# Patient Record
Sex: Male | Born: 1975 | Race: White | Hispanic: No | Marital: Married | Smoking: Never smoker
Health system: Southern US, Community
[De-identification: ages and names within clinical notes are randomized; demographics above are authoritative.]

## PROBLEM LIST (undated history)

## (undated) DIAGNOSIS — F909 Attention-deficit hyperactivity disorder, unspecified type: Secondary | ICD-10-CM

## (undated) DIAGNOSIS — Z789 Other specified health status: Secondary | ICD-10-CM

## (undated) DIAGNOSIS — I10 Essential (primary) hypertension: Secondary | ICD-10-CM

## (undated) HISTORY — PX: WISDOM TOOTH EXTRACTION: SHX21

## (undated) HISTORY — PX: ELBOW SURGERY: SHX618

---

## 2010-09-26 ENCOUNTER — Ambulatory Visit: Payer: Self-pay | Admitting: Family

## 2018-02-07 ENCOUNTER — Ambulatory Visit (HOSPITAL_COMMUNITY): Payer: Commercial Managed Care - PPO | Admitting: Certified Registered Nurse Anesthetist

## 2018-02-07 ENCOUNTER — Encounter (HOSPITAL_COMMUNITY)
Admission: RE | Disposition: A | Discharge status: Home / Self Care | Payer: Self-pay | Source: Ambulatory Visit | Attending: Neurosurgery

## 2018-02-07 ENCOUNTER — Encounter (HOSPITAL_COMMUNITY): Payer: Self-pay | Admitting: *Deleted

## 2018-02-07 ENCOUNTER — Ambulatory Visit (HOSPITAL_COMMUNITY): Payer: Commercial Managed Care - PPO

## 2018-02-07 ENCOUNTER — Ambulatory Visit (HOSPITAL_COMMUNITY)
Admission: RE | Admit: 2018-02-07 | Discharge: 2018-02-07 | Disposition: A | Discharge status: Home / Self Care | Payer: Commercial Managed Care - PPO | Source: Ambulatory Visit | Attending: Neurosurgery | Admitting: Neurosurgery

## 2018-02-07 ENCOUNTER — Other Ambulatory Visit: Payer: Self-pay | Admitting: Neurosurgery

## 2018-02-07 ENCOUNTER — Other Ambulatory Visit: Payer: Self-pay

## 2018-02-07 DIAGNOSIS — Z419 Encounter for procedure for purposes other than remedying health state, unspecified: Secondary | ICD-10-CM

## 2018-02-07 DIAGNOSIS — M5117 Intervertebral disc disorders with radiculopathy, lumbosacral region: Secondary | ICD-10-CM | POA: Insufficient documentation

## 2018-02-07 DIAGNOSIS — M5126 Other intervertebral disc displacement, lumbar region: Secondary | ICD-10-CM | POA: Diagnosis present

## 2018-02-07 DIAGNOSIS — M549 Dorsalgia, unspecified: Secondary | ICD-10-CM | POA: Diagnosis present

## 2018-02-07 HISTORY — PX: LUMBAR LAMINECTOMY/DECOMPRESSION MICRODISCECTOMY: SHX5026

## 2018-02-07 HISTORY — DX: Other specified health status: Z78.9

## 2018-02-07 LAB — HEMOGLOBIN: HEMOGLOBIN: 16.2 g/dL (ref 13.0–17.0)

## 2018-02-07 SURGERY — LUMBAR LAMINECTOMY/DECOMPRESSION MICRODISCECTOMY 1 LEVEL
Anesthesia: General | Site: Back | Laterality: Right

## 2018-02-07 MED ORDER — ONDANSETRON HCL 4 MG PO TABS: 4.0000 mg | ORAL_TABLET | Freq: Four times a day (QID) | ORAL | Status: DC | PRN

## 2018-02-07 MED ORDER — DEXAMETHASONE SODIUM PHOSPHATE 10 MG/ML IJ SOLN
INTRAMUSCULAR | Status: DC | PRN
  Administered 2018-02-07: 5 mg via INTRAVENOUS

## 2018-02-07 MED ORDER — SODIUM CHLORIDE 0.9% FLUSH: 3.0000 mL | INTRAVENOUS | Status: DC | PRN

## 2018-02-07 MED ORDER — BUPIVACAINE HCL (PF) 0.25 % IJ SOLN
INTRAMUSCULAR | Status: DC | PRN
  Administered 2018-02-07: 10 mL

## 2018-02-07 MED ORDER — ROCURONIUM BROMIDE 10 MG/ML (PF) SYRINGE
PREFILLED_SYRINGE | INTRAVENOUS | Status: DC | PRN
  Administered 2018-02-07: 10 mg via INTRAVENOUS
  Administered 2018-02-07: 50 mg via INTRAVENOUS

## 2018-02-07 MED ORDER — OXYCODONE HCL 5 MG PO TABS
ORAL_TABLET | ORAL | Status: AC
  Filled 2018-02-07: qty 1

## 2018-02-07 MED ORDER — ONDANSETRON HCL 4 MG/2ML IJ SOLN
INTRAMUSCULAR | Status: AC
  Filled 2018-02-07: qty 2

## 2018-02-07 MED ORDER — LIDOCAINE-EPINEPHRINE 1 %-1:100000 IJ SOLN
INTRAMUSCULAR | Status: AC
  Filled 2018-02-07: qty 1

## 2018-02-07 MED ORDER — OXYCODONE HCL 5 MG PO TABS
10.0000 mg | ORAL_TABLET | ORAL | Status: DC | PRN
  Administered 2018-02-07: 10 mg via ORAL

## 2018-02-07 MED ORDER — CEFAZOLIN SODIUM-DEXTROSE 2-4 GM/100ML-% IV SOLN: 2.0000 g | Freq: Three times a day (TID) | INTRAVENOUS | Status: DC

## 2018-02-07 MED ORDER — ACETAMINOPHEN 325 MG PO TABS: 650.0000 mg | ORAL_TABLET | ORAL | Status: DC | PRN

## 2018-02-07 MED ORDER — ALUM & MAG HYDROXIDE-SIMETH 200-200-20 MG/5ML PO SUSP: 30.0000 mL | Freq: Four times a day (QID) | ORAL | Status: DC | PRN

## 2018-02-07 MED ORDER — THROMBIN 5000 UNITS EX SOLR
CUTANEOUS | Status: AC
  Filled 2018-02-07: qty 5000

## 2018-02-07 MED ORDER — CEFAZOLIN SODIUM-DEXTROSE 2-4 GM/100ML-% IV SOLN
INTRAVENOUS | Status: AC
  Filled 2018-02-07: qty 100

## 2018-02-07 MED ORDER — SODIUM CHLORIDE 0.9 % IV SOLN: 250.0000 mL | INTRAVENOUS | Status: DC

## 2018-02-07 MED ORDER — BUPIVACAINE HCL (PF) 0.25 % IJ SOLN
INTRAMUSCULAR | Status: AC
  Filled 2018-02-07: qty 30

## 2018-02-07 MED ORDER — 0.9 % SODIUM CHLORIDE (POUR BTL) OPTIME
TOPICAL | Status: DC | PRN
  Administered 2018-02-07: 1000 mL

## 2018-02-07 MED ORDER — ONDANSETRON HCL 4 MG/2ML IJ SOLN: 4.0000 mg | Freq: Once | INTRAMUSCULAR | Status: DC | PRN

## 2018-02-07 MED ORDER — MIDAZOLAM HCL 2 MG/2ML IJ SOLN
INTRAMUSCULAR | Status: DC | PRN
  Administered 2018-02-07: 2 mg via INTRAVENOUS

## 2018-02-07 MED ORDER — SODIUM CHLORIDE 0.9% FLUSH
3.0000 mL | Freq: Two times a day (BID) | INTRAVENOUS | Status: DC
  Administered 2018-02-07: 3 mL via INTRAVENOUS

## 2018-02-07 MED ORDER — CYCLOBENZAPRINE HCL 10 MG PO TABS
ORAL_TABLET | ORAL | Status: AC
  Filled 2018-02-07: qty 1

## 2018-02-07 MED ORDER — OXYCODONE HCL 10 MG PO TABS: 10.0000 mg | ORAL_TABLET | ORAL | 0 refills | Status: DC | PRN

## 2018-02-07 MED ORDER — FENTANYL CITRATE (PF) 100 MCG/2ML IJ SOLN: 25.0000 ug | INTRAMUSCULAR | Status: DC | PRN

## 2018-02-07 MED ORDER — FENTANYL CITRATE (PF) 250 MCG/5ML IJ SOLN
INTRAMUSCULAR | Status: DC | PRN
  Administered 2018-02-07 (×2): 50 ug via INTRAVENOUS
  Administered 2018-02-07: 100 ug via INTRAVENOUS

## 2018-02-07 MED ORDER — MIDAZOLAM HCL 2 MG/2ML IJ SOLN
INTRAMUSCULAR | Status: AC
  Filled 2018-02-07: qty 2

## 2018-02-07 MED ORDER — SUGAMMADEX SODIUM 200 MG/2ML IV SOLN
INTRAVENOUS | Status: DC | PRN
  Administered 2018-02-07: 100 mg via INTRAVENOUS

## 2018-02-07 MED ORDER — FENTANYL CITRATE (PF) 250 MCG/5ML IJ SOLN
INTRAMUSCULAR | Status: AC
  Filled 2018-02-07: qty 5

## 2018-02-07 MED ORDER — BACITRACIN 50000 UNITS IM SOLR
INTRAMUSCULAR | Status: DC | PRN
  Administered 2018-02-07: 17:00:00

## 2018-02-07 MED ORDER — ACETAMINOPHEN 650 MG RE SUPP: 650.0000 mg | RECTAL | Status: DC | PRN

## 2018-02-07 MED ORDER — PHENOL 1.4 % MT LIQD: 1.0000 | OROMUCOSAL | Status: DC | PRN

## 2018-02-07 MED ORDER — CEFAZOLIN SODIUM-DEXTROSE 2-3 GM-%(50ML) IV SOLR
INTRAVENOUS | Status: DC | PRN
  Administered 2018-02-07: 2 g via INTRAVENOUS

## 2018-02-07 MED ORDER — HEMOSTATIC AGENTS (NO CHARGE) OPTIME
TOPICAL | Status: DC | PRN
  Administered 2018-02-07: 1

## 2018-02-07 MED ORDER — LIDOCAINE-EPINEPHRINE 1 %-1:100000 IJ SOLN
INTRAMUSCULAR | Status: DC | PRN
  Administered 2018-02-07: 10 mL

## 2018-02-07 MED ORDER — CYCLOBENZAPRINE HCL 10 MG PO TABS
10.0000 mg | ORAL_TABLET | Freq: Three times a day (TID) | ORAL | Status: DC | PRN
  Administered 2018-02-07: 10 mg via ORAL

## 2018-02-07 MED ORDER — ROCURONIUM BROMIDE 10 MG/ML (PF) SYRINGE
PREFILLED_SYRINGE | INTRAVENOUS | Status: AC
  Filled 2018-02-07: qty 10

## 2018-02-07 MED ORDER — LACTATED RINGERS IV SOLN
INTRAVENOUS | Status: DC
  Administered 2018-02-07: 13:00:00 via INTRAVENOUS

## 2018-02-07 MED ORDER — MENTHOL 3 MG MT LOZG: 1.0000 | LOZENGE | OROMUCOSAL | Status: DC | PRN

## 2018-02-07 MED ORDER — HYDROMORPHONE HCL 1 MG/ML IJ SOLN
0.5000 mg | INTRAMUSCULAR | Status: DC | PRN
  Administered 2018-02-07: 0.5 mg via INTRAVENOUS
  Filled 2018-02-07: qty 0.5

## 2018-02-07 MED ORDER — DEXAMETHASONE SODIUM PHOSPHATE 10 MG/ML IJ SOLN
INTRAMUSCULAR | Status: AC
  Filled 2018-02-07: qty 2

## 2018-02-07 MED ORDER — ONDANSETRON HCL 4 MG/2ML IJ SOLN
INTRAMUSCULAR | Status: DC | PRN
  Administered 2018-02-07: 4 mg via INTRAVENOUS

## 2018-02-07 MED ORDER — THROMBIN 5000 UNITS EX SOLR
CUTANEOUS | Status: DC | PRN
  Administered 2018-02-07 (×2): 5000 [IU] via TOPICAL

## 2018-02-07 MED ORDER — PROPOFOL 10 MG/ML IV BOLUS
INTRAVENOUS | Status: AC
  Filled 2018-02-07: qty 40

## 2018-02-07 MED ORDER — LIDOCAINE 2% (20 MG/ML) 5 ML SYRINGE
INTRAMUSCULAR | Status: AC
  Filled 2018-02-07: qty 5

## 2018-02-07 MED ORDER — ONDANSETRON HCL 4 MG/2ML IJ SOLN: 4.0000 mg | Freq: Four times a day (QID) | INTRAMUSCULAR | Status: DC | PRN

## 2018-02-07 MED ORDER — GABAPENTIN 300 MG PO CAPS: 300.0000 mg | ORAL_CAPSULE | Freq: Two times a day (BID) | ORAL | Status: DC

## 2018-02-07 MED ORDER — LIDOCAINE 2% (20 MG/ML) 5 ML SYRINGE
INTRAMUSCULAR | Status: DC | PRN
  Administered 2018-02-07: 60 mg via INTRAVENOUS

## 2018-02-07 MED ORDER — PROPOFOL 10 MG/ML IV BOLUS
INTRAVENOUS | Status: DC | PRN
  Administered 2018-02-07: 170 mg via INTRAVENOUS

## 2018-02-07 SURGICAL SUPPLY — 55 items
BAG DECANTER FOR FLEXI CONT (MISCELLANEOUS) ×3 IMPLANT
BENZOIN TINCTURE PRP APPL 2/3 (GAUZE/BANDAGES/DRESSINGS) ×3 IMPLANT
BLADE CLIPPER SURG (BLADE) IMPLANT
BLADE SURG 11 STRL SS (BLADE) ×3 IMPLANT
BUR CUTTER 7.0 ROUND (BURR) ×3 IMPLANT
BUR MATCHSTICK NEURO 3.0 LAGG (BURR) ×3 IMPLANT
CANISTER SUCT 3000ML PPV (MISCELLANEOUS) ×3 IMPLANT
CARTRIDGE OIL MAESTRO DRILL (MISCELLANEOUS) ×1 IMPLANT
CLOSURE WOUND 1/2 X4 (GAUZE/BANDAGES/DRESSINGS) ×1
DECANTER SPIKE VIAL GLASS SM (MISCELLANEOUS) ×3 IMPLANT
DERMABOND ADVANCED (GAUZE/BANDAGES/DRESSINGS) ×2
DERMABOND ADVANCED .7 DNX12 (GAUZE/BANDAGES/DRESSINGS) ×1 IMPLANT
DIFFUSER DRILL AIR PNEUMATIC (MISCELLANEOUS) ×3 IMPLANT
DRAPE HALF SHEET 40X57 (DRAPES) ×3 IMPLANT
DRAPE LAPAROTOMY 100X72X124 (DRAPES) ×3 IMPLANT
DRAPE MICROSCOPE LEICA (MISCELLANEOUS) ×3 IMPLANT
DRAPE SURG 17X23 STRL (DRAPES) ×3 IMPLANT
DRSG OPSITE POSTOP 3X4 (GAUZE/BANDAGES/DRESSINGS) ×3 IMPLANT
DRSG OPSITE POSTOP 4X6 (GAUZE/BANDAGES/DRESSINGS) ×3 IMPLANT
DURAPREP 26ML APPLICATOR (WOUND CARE) ×3 IMPLANT
ELECT REM PT RETURN 9FT ADLT (ELECTROSURGICAL) ×3
ELECTRODE REM PT RTRN 9FT ADLT (ELECTROSURGICAL) ×1 IMPLANT
GAUZE 4X4 16PLY RFD (DISPOSABLE) IMPLANT
GAUZE SPONGE 4X4 12PLY STRL (GAUZE/BANDAGES/DRESSINGS) ×3 IMPLANT
GLOVE BIO SURGEON STRL SZ7 (GLOVE) ×3 IMPLANT
GLOVE BIO SURGEON STRL SZ8 (GLOVE) ×9 IMPLANT
GLOVE BIO SURGEON STRL SZ8.5 (GLOVE) ×6 IMPLANT
GLOVE BIOGEL PI IND STRL 7.0 (GLOVE) ×1 IMPLANT
GLOVE BIOGEL PI IND STRL 8 (GLOVE) ×3 IMPLANT
GLOVE BIOGEL PI INDICATOR 7.0 (GLOVE) ×2
GLOVE BIOGEL PI INDICATOR 8 (GLOVE) ×6
GLOVE ECLIPSE 8.0 STRL XLNG CF (GLOVE) ×9 IMPLANT
GLOVE INDICATOR 8.5 STRL (GLOVE) ×3 IMPLANT
GOWN STRL REUS W/ TWL LRG LVL3 (GOWN DISPOSABLE) ×1 IMPLANT
GOWN STRL REUS W/ TWL XL LVL3 (GOWN DISPOSABLE) ×4 IMPLANT
GOWN STRL REUS W/TWL 2XL LVL3 (GOWN DISPOSABLE) IMPLANT
GOWN STRL REUS W/TWL LRG LVL3 (GOWN DISPOSABLE) ×2
GOWN STRL REUS W/TWL XL LVL3 (GOWN DISPOSABLE) ×8
KIT BASIN OR (CUSTOM PROCEDURE TRAY) ×3 IMPLANT
KIT TURNOVER KIT B (KITS) ×3 IMPLANT
NEEDLE HYPO 22GX1.5 SAFETY (NEEDLE) ×3 IMPLANT
NEEDLE SPNL 22GX3.5 QUINCKE BK (NEEDLE) ×3 IMPLANT
NS IRRIG 1000ML POUR BTL (IV SOLUTION) ×3 IMPLANT
OIL CARTRIDGE MAESTRO DRILL (MISCELLANEOUS) ×3
PACK LAMINECTOMY NEURO (CUSTOM PROCEDURE TRAY) ×3 IMPLANT
RUBBERBAND STERILE (MISCELLANEOUS) ×6 IMPLANT
SPONGE SURGIFOAM ABS GEL SZ50 (HEMOSTASIS) ×3 IMPLANT
STRIP CLOSURE SKIN 1/2X4 (GAUZE/BANDAGES/DRESSINGS) ×2 IMPLANT
SUT VIC AB 0 CT1 18XCR BRD8 (SUTURE) ×1 IMPLANT
SUT VIC AB 0 CT1 8-18 (SUTURE) ×2
SUT VIC AB 2-0 CT1 18 (SUTURE) ×3 IMPLANT
SUT VICRYL 4-0 PS2 18IN ABS (SUTURE) ×3 IMPLANT
TOWEL GREEN STERILE (TOWEL DISPOSABLE) ×3 IMPLANT
TOWEL GREEN STERILE FF (TOWEL DISPOSABLE) ×3 IMPLANT
WATER STERILE IRR 1000ML POUR (IV SOLUTION) ×3 IMPLANT

## 2018-02-07 NOTE — H&P (Signed)
Phillip MoralesJustin Thornton is an 42 y.o. male.   Chief Complaint: back and right leg pain HPI: 42 year old gentleman with severe back and right leg pain workup revealed a very large disc herniation with a free fragment migrating caudally due to patient progressive clinical syndrome imaging findings and failure conservative treatment I recommended laminectomy microdiscectomy at L5-S1 the right. I extensively went over the risks and benefits of the operation with him as well as perioperative course expectations of outcome and alternatives of surgery and he understood and agreed to proceed forward.  Past Medical History:  Diagnosis Date  . Medical history non-contributory     Past Surgical History:  Procedure Laterality Date  . ELBOW SURGERY     scar tissue resection    History reviewed. No pertinent family history. Social History:  reports that he has never smoked. He has never used smokeless tobacco. He reports that he drinks about 2.0 standard drinks of alcohol per week. He reports that he does not use drugs.  Allergies: No Known Allergies  Medications Prior to Admission  Medication Sig Dispense Refill  . gabapentin (NEURONTIN) 300 MG capsule Take 300 mg by mouth 2 (two) times daily.      Results for orders placed or performed during the hospital encounter of 02/07/18 (from the past 48 hour(s))  Hemoglobin     Status: None   Collection Time: 02/07/18  1:09 PM  Result Value Ref Range   Hemoglobin 16.2 13.0 - 17.0 g/dL    Comment: Performed at [facility], [address] [place], [place] [address]   No results found.  Review of Systems  Musculoskeletal: Positive for back pain.  Neurological: Positive for sensory change and focal weakness.    Blood pressure (!) 163/105, pulse 70, temperature 97.8 F (36.6 C), temperature source Oral, resp. rate 20, height 6\' 3"  (1.905 m), weight 93 kg, SpO2 98 %. Physical Exam  Constitutional: He is oriented to person, place, and time. He appears  well-developed and well-nourished.  HENT:  Head: Normocephalic.  Eyes: Pupils are equal, round, and reactive to light.  Neck: Normal range of motion.  Respiratory: Effort normal.  GI: Soft.  Musculoskeletal: Normal range of motion.  Neurological: He is alert and oriented to person, place, and time. He has normal strength. GCS eye subscore is 4. GCS verbal subscore is 5. GCS motor subscore is 6.  Strength is 4+ out of 5 plantar flexion right foot     Assessment/Plan 42 year old Jones a right-sided L5-S1 microdiscectomy  Kristianne Albin P, MD 02/07/2018, 3:25 PM

## 2018-02-07 NOTE — Op Note (Signed)
Preoperative diagnosis: Right S1 radiculopathy from herniated nucleus pulposis L5-S1 right  Postoperative diagnosis: Same  Procedure: Lumbar limiting microscopic discectomy L5-S1 right with microdissection of the right S1 nerve root microscopic discectomy  Surgeon: Jillyn HiddenGary Timira Bieda  Assist: Verlin DikeKimberly Meyran  Anesthesia: Gen.  EBL: The  History of present illness: 42 year old gentleman who's had severe back and right leg pain rating down S1 nerve root pattern workup revealed a very large disc herniation with free fragment migrating caudally. Into the patient's failure conservative treatment imaging findings and progressive clinical syndrome or recommended laminectomy microdiscectomy at L5-S1 on the right. I extensively went over the risks and benefits of the operation with him as well as perioperative course expectations of outcome and alternatives surgery and he understood and agreed to proceed forward.  Operative procedure: Patient brought into the or was induced under general anesthesia positioned prone the Wilson frame his back was prepped and draped in routine sterile fashion. Preoperative x-ray localize the appropriate level. After infiltration of 10 mL lidocaine with epi a midline incision was made and Bovie light cautery was used to take down the subcutaneous tissue and subperiosteal dissections care lamina of L5 and S1 on the right. Interoperative x-ray confirmed an indication proper level. So utilizing high-speed drill the inferior aspect lamina 5 medial facet complex super aspect of lamina of S1 was drilled down with close identified and removed in piecemeal fashion thecal sac was in visualized operative microscope was draped and brought into the field. Under Mike's cup illumination first further under biting the medial gutter allowed identification of the S1 pedicle and the S1 nerve root. The large free fragment disc herniation present within the axilla of the S1 nerve root this was teased out of  the axilla of the nerve hook and several large free fragments removed. Then the S1 nerve root was mobilized over the disc space disc space was incised and cleanout pituitary rongeurs. At the discectomy was no further stenosis or free fragments appreciated the wound scope was irrigated meticulous hemostasis was maintained Gelfoam was overlaid top of the dura the muscle fascia proximally layers with after Vicryl skin was closed running 4 subcuticular Dermabond benzo and Steri-Strips and sterile dressing was applied patient recovered in stable condition. At the case all needle, sponge counts were correct.

## 2018-02-07 NOTE — Anesthesia Postprocedure Evaluation (Signed)
Anesthesia Post Note  Patient: Lysle MoralesJustin Benett  Procedure(s) Performed: Microdiscectomy - Lumbar Five - Sacral One - right (Right Back)     Patient location during evaluation: PACU Anesthesia Type: General Level of consciousness: awake and alert Pain management: pain level controlled Vital Signs Assessment: post-procedure vital signs reviewed and stable Respiratory status: spontaneous breathing, nonlabored ventilation, respiratory function stable and patient connected to nasal cannula oxygen Cardiovascular status: blood pressure returned to baseline and stable Postop Assessment: no apparent nausea or vomiting Anesthetic complications: no    Last Vitals:  Vitals:   02/07/18 1854 02/07/18 1950  BP: (!) 153/92 (!) 141/83  Pulse: 64 74  Resp: 18 20  Temp: 36.6 C 37.1 C  SpO2: 100% 98%    Last Pain:  Vitals:   02/07/18 1950  TempSrc: Oral  PainSc:                  Daziyah Cogan

## 2018-02-07 NOTE — Discharge Instructions (Signed)
Wound Care Keep the incision clean and dry remove the outer dressing in 2 days, leave the Steri-Strips intact. Wrap with Saran wrap for showers only Do not put any creams, lotions, or ointments on incision. Leave steri-strips on back.  They will fall off by themselves.  Activity Walk each and every day, increasing distance each day. No lifting greater than 5 lbs.  No lifting no bending no twisting no driving or riding a car unless coming back and forth to see me.  Diet Resume your normal diet.   Return to Work Will be discussed at you follow up appointment.  Call Your Doctor If Any of These Occur Redness, drainage, or swelling at the wound.  Temperature greater than 101 degrees. Severe pain not relieved by pain medication. Incision starts to come apart.  Follow Up Appt Call today for appointment in 1-2 weeks ([phone]) or for problems.  If you have any hardware placed in your spine, you will need an x-ray before your appointment.   

## 2018-02-07 NOTE — Transfer of Care (Signed)
Immediate Anesthesia Transfer of Care Note  Patient: Phillip Thornton  Procedure(s) Performed: Microdiscectomy - Lumbar Five - Sacral One - right (Right Back)  Patient Location: PACU  Anesthesia Type:General  Level of Consciousness: awake, oriented and patient cooperative  Airway & Oxygen Therapy: Patient Spontanous Breathing and Patient connected to nasal cannula oxygen  Post-op Assessment: Report given to RN, Post -op Vital signs reviewed and stable and Patient moving all extremities  Post vital signs: Reviewed and stable  Last Vitals:  Vitals Value Taken Time  BP    Temp    Pulse 70 02/07/2018  5:42 PM  Resp 17 02/07/2018  5:42 PM  SpO2 98 % 02/07/2018  5:42 PM  Vitals shown include unvalidated device data.  Last Pain:  Vitals:   02/07/18 1320  TempSrc:   PainSc: 5       Patients Stated Pain Goal: 3 (02/07/18 1320)  Complications: No apparent anesthesia complications

## 2018-02-07 NOTE — Progress Notes (Signed)
Patient d/ced this evening as planned. Ate regular food and ambulated about 27000ft. Felt well and was discharged home accompanied with His significant other. Assessments remained unchanged prior to discharged.

## 2018-02-07 NOTE — Anesthesia Preprocedure Evaluation (Signed)
Anesthesia Evaluation  Patient identified by MRN, date of birth, ID band Patient awake    Reviewed: Allergy & Precautions, NPO status , Patient's Chart, lab work & pertinent test results  Airway Mallampati: II  TM Distance: >3 FB Neck ROM: Full    Dental  (+) Dental Advisory Given   Pulmonary neg pulmonary ROS,    breath sounds clear to auscultation       Cardiovascular negative cardio ROS   Rhythm:Regular Rate:Normal     Neuro/Psych HNP    GI/Hepatic negative GI ROS, Neg liver ROS,   Endo/Other  negative endocrine ROS  Renal/GU negative Renal ROS     Musculoskeletal   Abdominal   Peds  Hematology negative hematology ROS (+)   Anesthesia Other Findings   Reproductive/Obstetrics                             Lab Results  Component Value Date   HGB 16.2 02/07/2018   No results found for: CREATININE, BUN, NA, K, CL, CO2  Anesthesia Physical Anesthesia Plan  ASA: I  Anesthesia Plan: General   Post-op Pain Management:    Induction: Intravenous  PONV Risk Score and Plan: 2 and Dexamethasone, Ondansetron and Treatment may vary due to age or medical condition  Airway Management Planned: Oral ETT  Additional Equipment:   Intra-op Plan:   Post-operative Plan: Extubation in OR  Informed Consent: I have reviewed the patients History and Physical, chart, labs and discussed the procedure including the risks, benefits and alternatives for the proposed anesthesia with the patient or authorized representative who has indicated his/her understanding and acceptance.   Dental advisory given  Plan Discussed with: CRNA  Anesthesia Plan Comments:         Anesthesia Quick Evaluation

## 2018-02-07 NOTE — Anesthesia Procedure Notes (Signed)
Procedure Name: Intubation Date/Time: 02/07/2018 4:10 PM Performed by: Valda Favia, CRNA Pre-anesthesia Checklist: Patient identified, Emergency Drugs available, Suction available and Patient being monitored Patient Re-evaluated:Patient Re-evaluated prior to induction Oxygen Delivery Method: Circle System Utilized Preoxygenation: Pre-oxygenation with 100% oxygen Induction Type: IV induction Ventilation: Mask ventilation without difficulty Laryngoscope Size: Mac and 4 Grade View: Grade II Tube type: Oral Tube size: 7.5 mm Number of attempts: 1 Airway Equipment and Method: Stylet and Oral airway Placement Confirmation: ETT inserted through vocal cords under direct vision,  positive ETCO2 and breath sounds checked- equal and bilateral Secured at: 23 cm Tube secured with: Tape Dental Injury: Teeth and Oropharynx as per pre-operative assessment

## 2018-02-08 ENCOUNTER — Encounter (HOSPITAL_COMMUNITY): Payer: Self-pay | Admitting: Neurosurgery

## 2018-03-11 ENCOUNTER — Other Ambulatory Visit: Payer: Self-pay | Admitting: Otolaryngology

## 2018-03-11 DIAGNOSIS — H918X1 Other specified hearing loss, right ear: Secondary | ICD-10-CM

## 2018-03-11 DIAGNOSIS — IMO0001 Reserved for inherently not codable concepts without codable children: Secondary | ICD-10-CM

## 2018-03-18 ENCOUNTER — Other Ambulatory Visit: Payer: Commercial Managed Care - PPO

## 2018-03-22 ENCOUNTER — Ambulatory Visit
Admission: RE | Admit: 2018-03-22 | Discharge: 2018-03-22 | Disposition: A | Discharge status: Home / Self Care | Payer: Commercial Managed Care - PPO | Source: Ambulatory Visit | Attending: Otolaryngology | Admitting: Otolaryngology

## 2018-03-22 DIAGNOSIS — IMO0001 Reserved for inherently not codable concepts without codable children: Secondary | ICD-10-CM

## 2018-03-22 DIAGNOSIS — H918X1 Other specified hearing loss, right ear: Secondary | ICD-10-CM

## 2018-03-22 MED ORDER — GADOBENATE DIMEGLUMINE 529 MG/ML IV SOLN
19.0000 mL | Freq: Once | INTRAVENOUS | Status: AC | PRN
  Administered 2018-03-22: 19 mL via INTRAVENOUS

## 2019-11-11 ENCOUNTER — Ambulatory Visit: Payer: Self-pay | Attending: Internal Medicine

## 2019-11-11 DIAGNOSIS — Z23 Encounter for immunization: Secondary | ICD-10-CM

## 2019-11-11 NOTE — Progress Notes (Signed)
   Covid-19 Vaccination Clinic  Name:  Phillip Thornton    MRN: [id number] DOB: Oct 31, 1975  11/11/2019  Mr. Blick was observed post Covid-19 immunization for 15 minutes without incident. He was provided with Vaccine Information Sheet and instruction to access the V-Safe system.   Mr. Alarie was instructed to call 911 with any severe reactions post vaccine: Marland Kitchen Difficulty breathing  . Swelling of face and throat  . A fast heartbeat  . A bad rash all over body  . Dizziness and weakness   Immunizations Administered    Name Date Dose VIS Date Route   Pfizer COVID-19 Vaccine 11/11/2019  9:10 AM 0.3 mL 08/23/2018 Intramuscular   Manufacturer: [employer], [employer]   Lot: [id number]   NDC: [address]-3

## 2019-12-04 ENCOUNTER — Ambulatory Visit: Payer: Self-pay

## 2019-12-09 ENCOUNTER — Ambulatory Visit: Payer: Self-pay | Attending: Internal Medicine

## 2019-12-09 DIAGNOSIS — Z23 Encounter for immunization: Secondary | ICD-10-CM

## 2019-12-09 NOTE — Progress Notes (Signed)
   Covid-19 Vaccination Clinic  Name:  Phillip Thornton    MRN: [id number] DOB: 1976/06/15  12/09/2019  Phillip Thornton was observed post Covid-19 immunization for 15 minutes without incident. He was provided with Vaccine Information Sheet and instruction to access the V-Safe system.   [name] was instructed to call 911 with any severe reactions post vaccine: Marland Kitchen Difficulty breathing  . Swelling of face and throat  . A fast heartbeat  . A bad rash all over body  . Dizziness and weakness   Immunizations Administered    Name Date Dose VIS Date Route   Pfizer COVID-19 Vaccine 12/09/2019  9:55 AM 0.3 mL 08/23/2018 Intramuscular   Manufacturer: [employer], [employer]   Lot: [id number]   NDC: [address]-3

## 2020-01-14 IMAGING — MR MR HEAD WO/W CM
11 of 12 series · 39 of 48 positions shown · IV contrast (multihance)
Comparison: None.

CLINICAL DATA: 42-year-old male with asymmetric right ear hearing
loss. No known injury.

EXAM:
MRI HEAD WITHOUT AND WITH CONTRAST
TECHNIQUE: Multiplanar, multiecho pulse sequences of the brain and surrounding
structures were obtained without and with intravenous contrast.
CONTRAST:  19mL MULTIHANCE GADOBENATE DIMEGLUMINE 529 MG/ML IV SOLN

[Series 2: T1 · sagittal · 5.0mm · 0.47mm/px · 2 of 21 slices shown (1 of 3)]
[im 1/21]
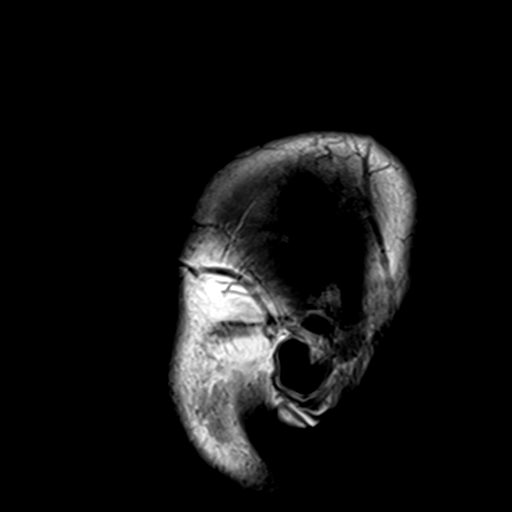
[im 21/21]
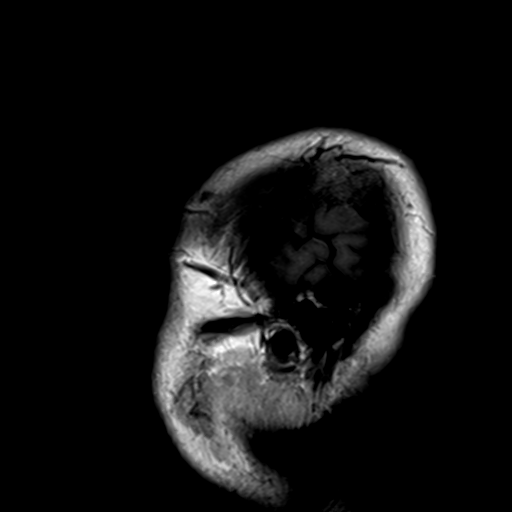

[Series 3: DWI · axial · 3.0mm · 1.88mm/px · z∈[-7,+140]mm · 11 of 100 slices shown (1 of 2)]
[im 1/100]
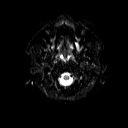
[im 10/100]
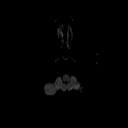
[im 20/100]
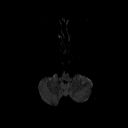
[im 30/100]
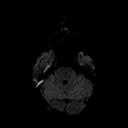
[im 40/100]
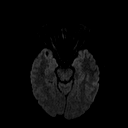
[im 50/100]
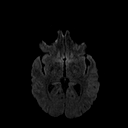
[im 60/100]
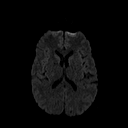
[im 70/100]
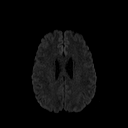
[im 80/100]
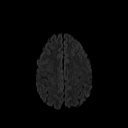
[im 90/100]
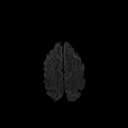
[im 100/100]
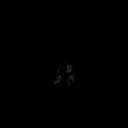

[Series 4: DWI · axial · 3.0mm · 1.88mm/px · z∈[-7,+140]mm · 5 of 49 slices shown (2 of 2)]
[im 1/49]
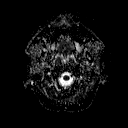
[im 13/49]
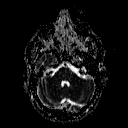
[im 25/49]
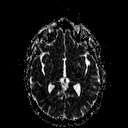
[im 37/49]
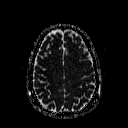
[im 49/49]
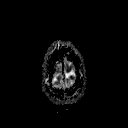

[Series 5: T2 · axial · 5.0mm · 0.47mm/px · z∈[-17,+138]mm · 3 of 25 slices shown]
[im 1/25]
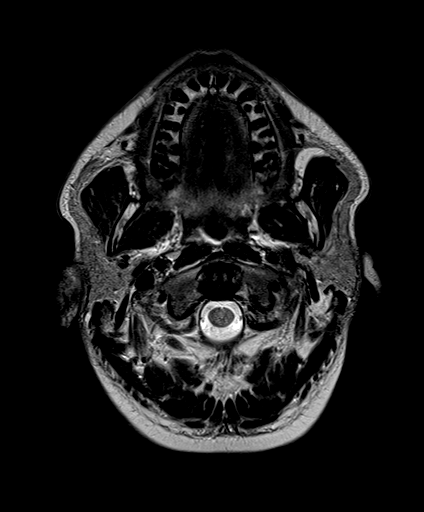
[im 13/25]
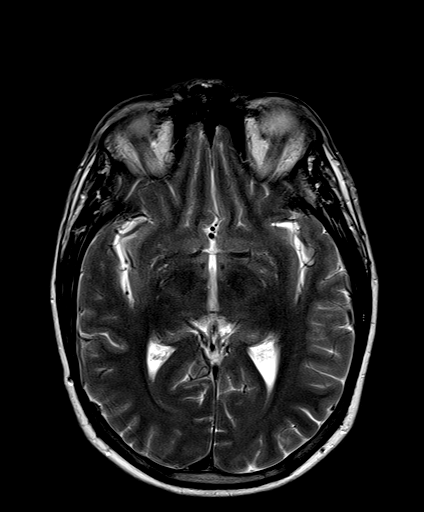
[im 25/25]
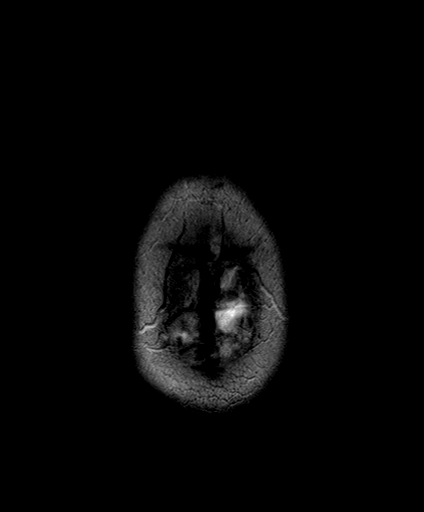

[Series 6: FLAIR · axial · 3.0mm · 0.47mm/px · z∈[-17,+138]mm · 3 of 27 slices shown]
[im 1/27]
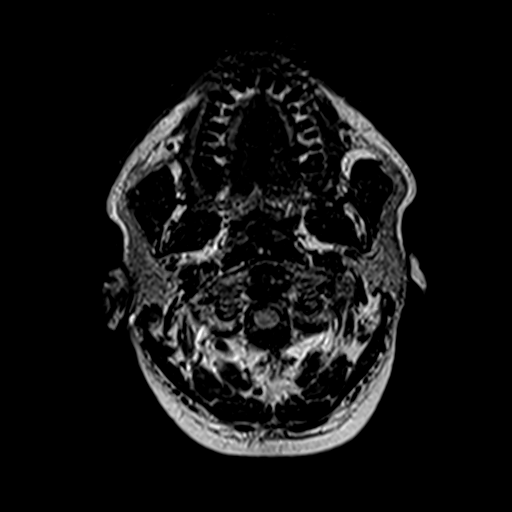
[im 14/27]
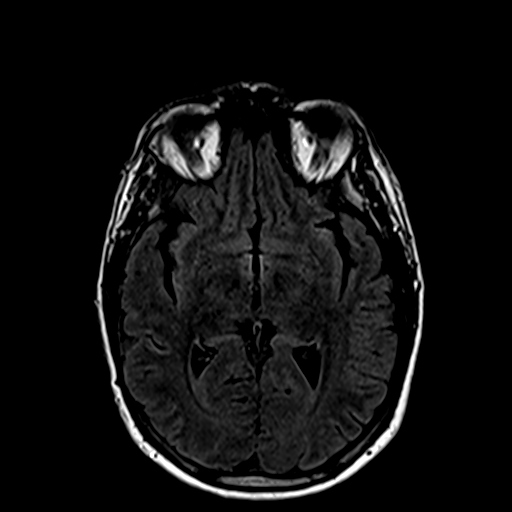
[im 27/27]
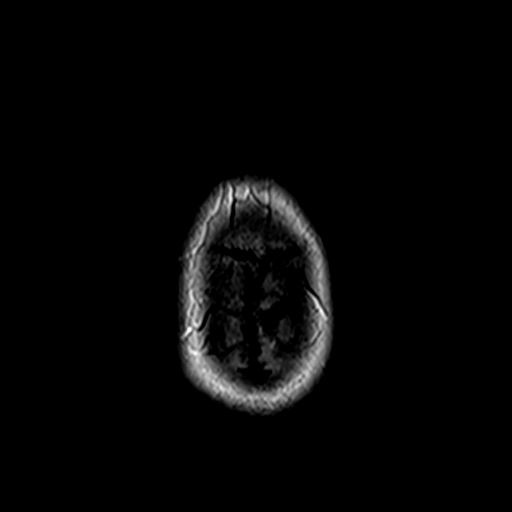

[Series 7: mip_images(sw) · axial · 16.0mm · 0.94mm/px · z∈[-11,+132]mm · 8 of 73 slices shown]
[im 1/73]
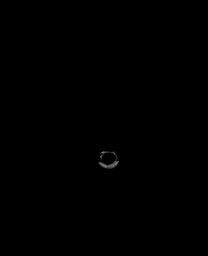
[im 11/73]
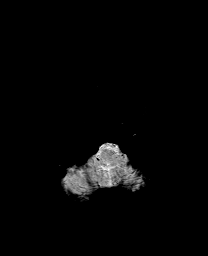
[im 21/73]
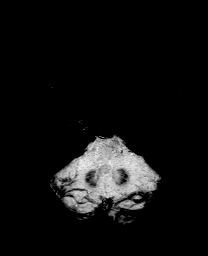
[im 31/73]
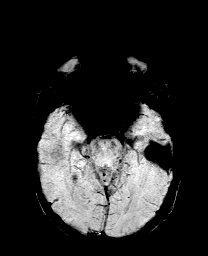
[im 42/73]
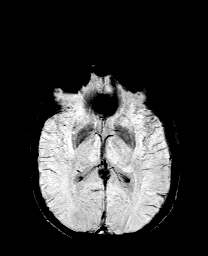
[im 52/73]
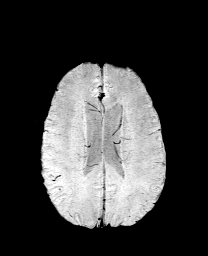
[im 62/73]
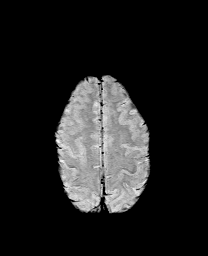
[im 73/73]
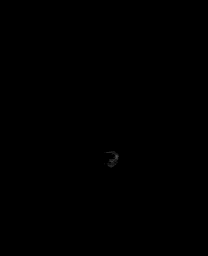

[Series 9: T1 · coronal · 3.0mm · 0.35mm/px · 1 of 11 slices shown (2 of 3)]
[im 1/11]
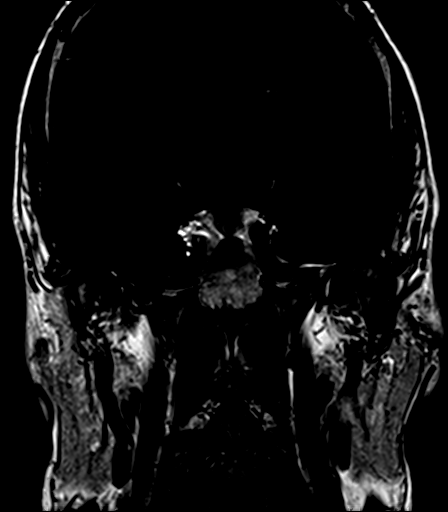

[Series 10: T1 · axial · 3.0mm · 0.35mm/px · 1 of 11 slices shown (3 of 3)]
[im 1/11]
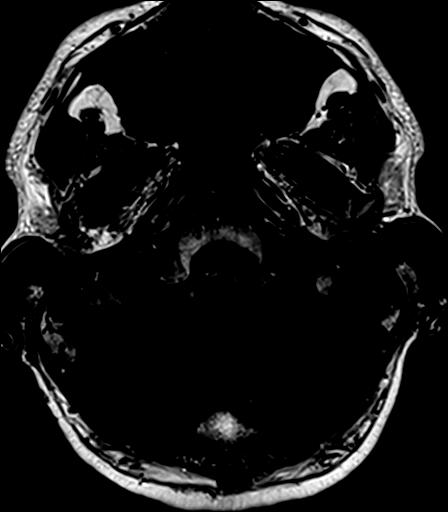

[Series 11: bSSFP · axial · 1.0mm · 0.28mm/px · z∈[+14,+37]mm · 3 of 36 slices shown]
[im 1/36]
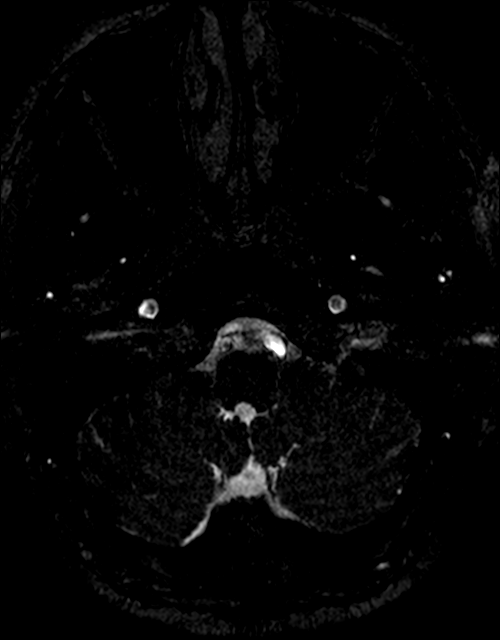
[im 12/36]
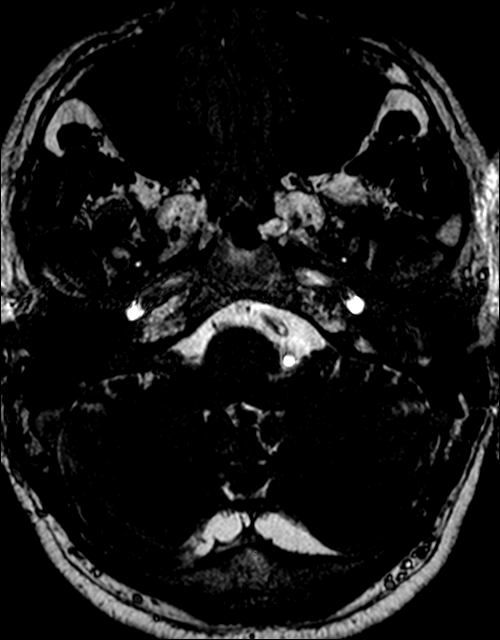
[im 24/36]
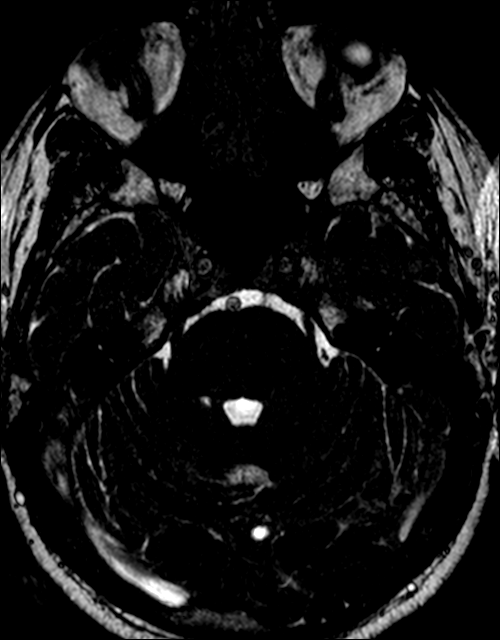

[Series 12: T1 post-contrast · coronal · 3.0mm · 0.35mm/px · 1 of 11 slices shown (1 of 2)]
[im 1/11]
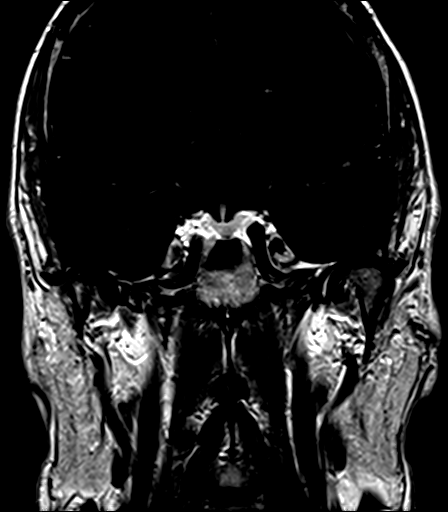

[Series 13: T1 post-contrast · axial · 3.0mm · 0.35mm/px · 1 of 11 slices shown (2 of 2)]
[im 1/11]
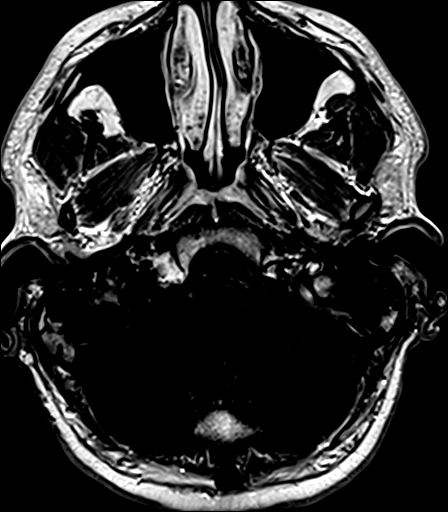

[39 of 48 positions shown; findings below may reference images not displayed]

FINDINGS: Brain: Cerebral volume is within normal limits. No restricted
diffusion to suggest acute infarction. No midline shift, evidence of
mass lesion, ventriculomegaly, extra-axial collection or acute
intracranial hemorrhage. Cervicomedullary junction and pituitary are
within normal limits.

Gray and white matter signal is within normal limits throughout the
brain. No encephalomalacia or chronic cerebral blood products
identified. No abnormal enhancement identified. No dural thickening.

Vascular: Major intracranial vascular flow voids are preserved. The
distal left vertebral artery is dominant and dolichoectatic with
tortuosity at the left cerebellopontine angle (series 5, image 7).

Skull and upper cervical spine: Negative visible cervical spine.
Normal bone marrow signal.

Sinuses/Orbits: Normal orbits soft tissues. Trace paranasal sinus
mucosal thickening. Scalp and face soft tissues appear negative.

Other: Dedicated internal auditory canal imaging. Distal vertebral
artery tortuosity with mass effect near the left [DATE] cranial nerve
root entry zone as seen on series 5, image 7. Otherwise normal
cerebellopontine angles. Normal bilateral cisternal and
intracanalicular 7th and 8th cranial nerve segments. Symmetric and
normal appearing T2 signal in the bilateral cochlea and vestibular
structures. No abnormal enhancement identified.

Trace mastoid air cell fluid on the right. Left mastoids are clear.
Normal stylomastoid foramina. Negative nasopharynx aside from trace
layering retained secretions on series 5, image 1. Negative parotid
glands.
IMPRESSION: 1. Normal internal auditory imaging aside from dolichoectatic distal
left vertebral artery with mass effect on or near the left 7th/8th
cranial nerve root entry zone.
2.  Normal MRI appearance of the brain.

## 2021-07-28 ENCOUNTER — Other Ambulatory Visit: Payer: Self-pay | Admitting: Orthopedic Surgery

## 2021-07-28 NOTE — Progress Notes (Addendum)
COVID swab appointment: N/A  COVID Vaccine Completed: Yes x2 Date COVID Vaccine completed: 11-11-19 12-09-19 Has received booster:  No COVID vaccine manufacturer: Pfizer      Date of COVID positive in last 90 days:  No  PCP - Alysia Penna, MD (office note on chart) Cardiologist - N/A  Chest x-ray -  N/A EKG - 07-29-21 Epic Stress Test -  N/A ECHO -  N/A Cardiac Cath -  N/A Pacemaker/ICD device last checked: Spinal Cord Stimulator:  Sleep Study -  N/A CPAP -   Fasting Blood Sugar -  N/A Checks Blood Sugar _____ times a day  Blood Thinner Instructions: N/A Aspirin Instructions: Last Dose:  Activity level:   Can go up a flight of stairs and perform activities of daily living without stopping and without symptoms of chest pain or shortness of breath.  Able to exercise without symptoms  Anesthesia review:  BP elevated at PAT 172/97 and on recheck 156/108.  Patient recently diagnosed with HTN and started on Losartan, took first dose 07-28-21.  Patient denies headache, chest pain or SOB.  He does not have a BP cuff at home to monitor.  Patient denies shortness of breath, fever, cough and chest pain at PAT appointment  Patient verbalized understanding of instructions that were given to them at the PAT appointment. Patient was also instructed that they will need to review over the PAT instructions again at home before surgery.

## 2021-07-28 NOTE — Patient Instructions (Addendum)
DUE TO COVID-19 ONLY ONE VISITOR IS ALLOWED TO COME WITH YOU AND STAY IN THE WAITING ROOM ONLY DURING PRE OP AND PROCEDURE.   **NO VISITORS ARE ALLOWED IN THE SHORT STAY AREA OR RECOVERY ROOM!!**  You are not required to quarantine, however you are required to wear a well-fitted mask when you are out and around people not in your household.  Hand Hygiene often Do NOT share personal items Notify your provider if you are in close contact with someone who has COVID or you develop fever 100.4 or greater, new onset of sneezing, cough, sore throat, shortness of breath or body aches.   Your procedure is scheduled on: Thursday, 07-31-21   Report to [facility] Main  Entrance     Report to admitting at 2:45 PM   Call this number if you have problems the morning of surgery [phone]   Do not eat food :After Midnight.   May have liquids until 2:00 PM day of surgery  CLEAR LIQUID DIET  Foods Allowed                                                                     Foods Excluded  Water, Black Coffee (no milk/no creamer) and tea, regular and decaf                              liquids that you cannot  Plain Jell-O in any flavor  (No red)                         see through such as: Fruit ices (not with fruit pulp)                                 milk, soups, orange juice  Iced Popsicles (No red)                                    All solid food                             Apple juices Sports drinks like Gatorade (No red) Lightly seasoned clear broth or consume(fat free) Sugar    Oral Hygiene is also important to reduce your risk of infection.                                    Remember - BRUSH YOUR TEETH THE MORNING OF SURGERY WITH YOUR REGULAR TOOTHPASTE   Do NOT smoke after Midnight   Take these medicines the morning of surgery with A SIP OF WATER:  Oxycodone   Stop all vitamins and herbal supplements a week before surgery             You may not have any metal on your  body including  jewelry, and body piercing             Do not wear  lotions,  powders, cologne, or deodorant              Men may shave face and neck.  Do not bring valuables to the hospital. [facility] IS NOT RESPONSIBLE FOR VALUABLES.   Contacts, dentures or bridgework may not be worn into surgery.    Patients discharged the day of surgery will not be allowed to drive home.   A responsible adult must remain with you for 24 hours after surgery.  Please read over the following fact sheets you were given: IF YOU HAVE QUESTIONS ABOUT YOUR PRE OP INSTRUCTIONS PLEASE CALL [phone] [facility] - Preparing for Surgery Before surgery, you can play an important role.  Because skin is not sterile, your skin needs to be as free of germs as possible.  You can reduce the number of germs on your skin by washing with CHG (chlorahexidine gluconate) soap before surgery.  CHG is an antiseptic cleaner which kills germs and bonds with the skin to continue killing germs even after washing. Please DO NOT use if you have an allergy to CHG or antibacterial soaps.  If your skin becomes reddened/irritated stop using the CHG and inform your nurse when you arrive at Short Stay. Do not shave (including legs and underarms) for at least 48 hours prior to the first CHG shower.  You may shave your face/neck.  Please follow these instructions carefully:  1.  Shower with CHG Soap the night before surgery and the  morning of surgery.  2.  If you choose to wash your hair, wash your hair first as usual with your normal  shampoo.  3.  After you shampoo, rinse your hair and body thoroughly to remove the shampoo.                             4.  Use CHG as you would any other liquid soap.  You can apply chg directly to the skin and wash.  Gently with a scrungie or clean washcloth.  5.  Apply the CHG Soap to your body ONLY FROM THE NECK DOWN.   Do   not use on face/ open                           Wound or open sores.  Avoid contact with eyes, ears mouth and   genitals (private parts).                       Wash face,  Genitals (private parts) with your normal soap.             6.  Wash thoroughly, paying special attention to the area where your    surgery  will be performed.  7.  Thoroughly rinse your body with warm water from the neck down.  8.  DO NOT shower/wash with your normal soap after using and rinsing off the CHG Soap.                9.  Pat yourself dry with a clean towel.            10.  Wear clean pajamas.            11.  Place clean sheets on your bed the night of your first shower and do not  sleep with pets. Day of Surgery : Do not apply any lotions/deodorants the  morning of surgery.  Please wear clean clothes to the hospital/surgery center.  FAILURE TO FOLLOW THESE INSTRUCTIONS MAY RESULT IN THE CANCELLATION OF YOUR SURGERY  PATIENT SIGNATURE_________________________________  NURSE SIGNATURE__________________________________  ________________________________________________________________________

## 2021-07-28 NOTE — Progress Notes (Signed)
Surgery orders requested via Epic inbox. °

## 2021-07-29 ENCOUNTER — Encounter (HOSPITAL_COMMUNITY): Payer: Self-pay

## 2021-07-29 ENCOUNTER — Encounter (HOSPITAL_COMMUNITY)
Admission: RE | Admit: 2021-07-29 | Discharge: 2021-07-29 | Disposition: A | Discharge status: Home / Self Care | Payer: BC Managed Care – PPO | Source: Ambulatory Visit | Attending: Orthopedic Surgery | Admitting: Orthopedic Surgery

## 2021-07-29 ENCOUNTER — Other Ambulatory Visit: Payer: Self-pay

## 2021-07-29 DIAGNOSIS — S42001A Fracture of unspecified part of right clavicle, initial encounter for closed fracture: Secondary | ICD-10-CM | POA: Diagnosis present

## 2021-07-29 DIAGNOSIS — Z01810 Encounter for preprocedural cardiovascular examination: Secondary | ICD-10-CM | POA: Insufficient documentation

## 2021-07-29 DIAGNOSIS — Y9323 Activity, snow (alpine) (downhill) skiing, snow boarding, sledding, tobogganing and snow tubing: Secondary | ICD-10-CM | POA: Diagnosis not present

## 2021-07-29 DIAGNOSIS — I10 Essential (primary) hypertension: Secondary | ICD-10-CM | POA: Diagnosis not present

## 2021-07-29 DIAGNOSIS — Z79899 Other long term (current) drug therapy: Secondary | ICD-10-CM | POA: Diagnosis not present

## 2021-07-29 HISTORY — DX: Essential (primary) hypertension: I10

## 2021-07-29 HISTORY — DX: Attention-deficit hyperactivity disorder, unspecified type: F90.9

## 2021-07-31 ENCOUNTER — Encounter (HOSPITAL_COMMUNITY)
Admission: RE | Disposition: A | Discharge status: Home / Self Care | Payer: Self-pay | Source: Ambulatory Visit | Attending: Orthopedic Surgery

## 2021-07-31 ENCOUNTER — Ambulatory Visit (HOSPITAL_COMMUNITY): Payer: BC Managed Care – PPO | Admitting: Physician Assistant

## 2021-07-31 ENCOUNTER — Encounter (HOSPITAL_COMMUNITY): Payer: Self-pay | Admitting: Orthopedic Surgery

## 2021-07-31 ENCOUNTER — Ambulatory Visit (HOSPITAL_COMMUNITY)
Admission: RE | Admit: 2021-07-31 | Discharge: 2021-07-31 | Disposition: A | Discharge status: Home / Self Care | Payer: BC Managed Care – PPO | Source: Ambulatory Visit | Attending: Orthopedic Surgery | Admitting: Orthopedic Surgery

## 2021-07-31 ENCOUNTER — Ambulatory Visit (HOSPITAL_COMMUNITY): Payer: BC Managed Care – PPO | Admitting: Certified Registered"

## 2021-07-31 ENCOUNTER — Ambulatory Visit (HOSPITAL_COMMUNITY): Payer: BC Managed Care – PPO

## 2021-07-31 DIAGNOSIS — Z419 Encounter for procedure for purposes other than remedying health state, unspecified: Secondary | ICD-10-CM

## 2021-07-31 DIAGNOSIS — I10 Essential (primary) hypertension: Secondary | ICD-10-CM | POA: Insufficient documentation

## 2021-07-31 DIAGNOSIS — S42001A Fracture of unspecified part of right clavicle, initial encounter for closed fracture: Secondary | ICD-10-CM | POA: Insufficient documentation

## 2021-07-31 DIAGNOSIS — Z79899 Other long term (current) drug therapy: Secondary | ICD-10-CM | POA: Insufficient documentation

## 2021-07-31 DIAGNOSIS — Y9323 Activity, snow (alpine) (downhill) skiing, snow boarding, sledding, tobogganing and snow tubing: Secondary | ICD-10-CM | POA: Insufficient documentation

## 2021-07-31 HISTORY — PX: ORIF CLAVICULAR FRACTURE: SHX5055

## 2021-07-31 SURGERY — OPEN REDUCTION INTERNAL FIXATION (ORIF) CLAVICULAR FRACTURE
Anesthesia: General | Laterality: Right

## 2021-07-31 MED ORDER — ROCURONIUM BROMIDE 100 MG/10ML IV SOLN
INTRAVENOUS | Status: DC | PRN
  Administered 2021-07-31: 60 mg via INTRAVENOUS

## 2021-07-31 MED ORDER — FENTANYL CITRATE (PF) 100 MCG/2ML IJ SOLN
INTRAMUSCULAR | Status: DC | PRN
  Administered 2021-07-31 (×2): 50 ug via INTRAVENOUS
  Administered 2021-07-31: 100 ug via INTRAVENOUS

## 2021-07-31 MED ORDER — CEFAZOLIN SODIUM-DEXTROSE 2-4 GM/100ML-% IV SOLN
2.0000 g | INTRAVENOUS | Status: AC
  Administered 2021-07-31: 2 g via INTRAVENOUS
  Filled 2021-07-31: qty 100

## 2021-07-31 MED ORDER — BUPIVACAINE-EPINEPHRINE (PF) 0.25% -1:200000 IJ SOLN
INTRAMUSCULAR | Status: AC
  Filled 2021-07-31: qty 30

## 2021-07-31 MED ORDER — METHOCARBAMOL 500 MG PO TABS: 500.0000 mg | ORAL_TABLET | Freq: Four times a day (QID) | ORAL | Status: DC | PRN

## 2021-07-31 MED ORDER — FENTANYL CITRATE PF 50 MCG/ML IJ SOSY
25.0000 ug | PREFILLED_SYRINGE | INTRAMUSCULAR | Status: DC | PRN
  Administered 2021-07-31: 50 ug via INTRAVENOUS

## 2021-07-31 MED ORDER — CHLORHEXIDINE GLUCONATE 0.12 % MT SOLN
15.0000 mL | Freq: Once | OROMUCOSAL | Status: AC
  Administered 2021-07-31: 15 mL via OROMUCOSAL

## 2021-07-31 MED ORDER — FENTANYL CITRATE PF 50 MCG/ML IJ SOSY
PREFILLED_SYRINGE | INTRAMUSCULAR | Status: AC
  Filled 2021-07-31: qty 1

## 2021-07-31 MED ORDER — SODIUM CHLORIDE 0.9 % IR SOLN
Status: DC | PRN
  Administered 2021-07-31: 1000 mL

## 2021-07-31 MED ORDER — DEXAMETHASONE SODIUM PHOSPHATE 10 MG/ML IJ SOLN
INTRAMUSCULAR | Status: DC | PRN
Start: 2021-07-31 — End: 2021-07-31
  Administered 2021-07-31: 8 mg via INTRAVENOUS

## 2021-07-31 MED ORDER — HYDROMORPHONE HCL 1 MG/ML IJ SOLN
INTRAMUSCULAR | Status: AC
  Administered 2021-07-31: 0.5 mg via INTRAVENOUS
  Filled 2021-07-31: qty 1

## 2021-07-31 MED ORDER — PHENYLEPHRINE 40 MCG/ML (10ML) SYRINGE FOR IV PUSH (FOR BLOOD PRESSURE SUPPORT)
PREFILLED_SYRINGE | INTRAVENOUS | Status: AC
  Filled 2021-07-31: qty 10

## 2021-07-31 MED ORDER — FENTANYL CITRATE PF 50 MCG/ML IJ SOSY
PREFILLED_SYRINGE | INTRAMUSCULAR | Status: AC
  Administered 2021-07-31: 50 ug via INTRAVENOUS
  Filled 2021-07-31: qty 1

## 2021-07-31 MED ORDER — SUGAMMADEX SODIUM 200 MG/2ML IV SOLN
INTRAVENOUS | Status: DC | PRN
  Administered 2021-07-31: 200 mg via INTRAVENOUS

## 2021-07-31 MED ORDER — ROCURONIUM BROMIDE 10 MG/ML (PF) SYRINGE
PREFILLED_SYRINGE | INTRAVENOUS | Status: AC
  Filled 2021-07-31: qty 10

## 2021-07-31 MED ORDER — METHOCARBAMOL 500 MG IVPB - SIMPLE MED: 500.0000 mg | Freq: Four times a day (QID) | INTRAVENOUS | Status: DC | PRN

## 2021-07-31 MED ORDER — LACTATED RINGERS IV SOLN: INTRAVENOUS | Status: DC

## 2021-07-31 MED ORDER — LIDOCAINE HCL (PF) 2 % IJ SOLN
INTRAMUSCULAR | Status: AC
  Filled 2021-07-31: qty 5

## 2021-07-31 MED ORDER — PROMETHAZINE HCL 25 MG/ML IJ SOLN: 6.2500 mg | INTRAMUSCULAR | Status: DC | PRN

## 2021-07-31 MED ORDER — OXYCODONE HCL 5 MG PO TABS: 5.0000 mg | ORAL_TABLET | ORAL | Status: DC | PRN

## 2021-07-31 MED ORDER — ONDANSETRON HCL 4 MG/2ML IJ SOLN
INTRAMUSCULAR | Status: AC
  Filled 2021-07-31: qty 2

## 2021-07-31 MED ORDER — PHENYLEPHRINE 40 MCG/ML (10ML) SYRINGE FOR IV PUSH (FOR BLOOD PRESSURE SUPPORT)
PREFILLED_SYRINGE | INTRAVENOUS | Status: DC | PRN
  Administered 2021-07-31: 160 ug via INTRAVENOUS
  Administered 2021-07-31: 120 ug via INTRAVENOUS

## 2021-07-31 MED ORDER — DEXAMETHASONE SODIUM PHOSPHATE 10 MG/ML IJ SOLN
INTRAMUSCULAR | Status: AC
  Filled 2021-07-31: qty 1

## 2021-07-31 MED ORDER — ONDANSETRON HCL 4 MG PO TABS
4.0000 mg | ORAL_TABLET | Freq: Four times a day (QID) | ORAL | Status: DC | PRN
  Filled 2021-07-31: qty 1

## 2021-07-31 MED ORDER — ONDANSETRON HCL 4 MG/2ML IJ SOLN: 4.0000 mg | Freq: Four times a day (QID) | INTRAMUSCULAR | Status: DC | PRN

## 2021-07-31 MED ORDER — LIDOCAINE HCL (CARDIAC) PF 100 MG/5ML IV SOSY
PREFILLED_SYRINGE | INTRAVENOUS | Status: DC | PRN
  Administered 2021-07-31: 100 mg via INTRAVENOUS

## 2021-07-31 MED ORDER — OXYCODONE HCL 5 MG PO TABS: 10.0000 mg | ORAL_TABLET | ORAL | Status: DC | PRN

## 2021-07-31 MED ORDER — HYDROMORPHONE HCL 1 MG/ML IJ SOLN
0.5000 mg | INTRAMUSCULAR | Status: DC | PRN
  Administered 2021-07-31: 0.5 mg via INTRAVENOUS

## 2021-07-31 MED ORDER — PROPOFOL 10 MG/ML IV BOLUS
INTRAVENOUS | Status: AC
  Filled 2021-07-31: qty 20

## 2021-07-31 MED ORDER — ACETAMINOPHEN 500 MG PO TABS
1000.0000 mg | ORAL_TABLET | Freq: Once | ORAL | Status: AC
  Administered 2021-07-31: 1000 mg via ORAL
  Filled 2021-07-31: qty 2

## 2021-07-31 MED ORDER — METOCLOPRAMIDE HCL 5 MG PO TABS
5.0000 mg | ORAL_TABLET | Freq: Three times a day (TID) | ORAL | Status: DC | PRN
  Filled 2021-07-31: qty 2

## 2021-07-31 MED ORDER — PROPOFOL 10 MG/ML IV BOLUS
INTRAVENOUS | Status: DC | PRN
Start: 2021-07-31 — End: 2021-07-31
  Administered 2021-07-31: 200 mg via INTRAVENOUS

## 2021-07-31 MED ORDER — ORAL CARE MOUTH RINSE: 15.0000 mL | Freq: Once | OROMUCOSAL | Status: AC

## 2021-07-31 MED ORDER — OXYCODONE HCL 5 MG PO TABS
ORAL_TABLET | ORAL | Status: AC
  Filled 2021-07-31: qty 1

## 2021-07-31 MED ORDER — METOCLOPRAMIDE HCL 5 MG/ML IJ SOLN: 5.0000 mg | Freq: Three times a day (TID) | INTRAMUSCULAR | Status: DC | PRN

## 2021-07-31 MED ORDER — BUPIVACAINE-EPINEPHRINE 0.25% -1:200000 IJ SOLN
INTRAMUSCULAR | Status: DC | PRN
  Administered 2021-07-31 (×2): 10 mL

## 2021-07-31 MED ORDER — FENTANYL CITRATE (PF) 100 MCG/2ML IJ SOLN
INTRAMUSCULAR | Status: AC
  Filled 2021-07-31: qty 2

## 2021-07-31 MED ORDER — OXYCODONE HCL 5 MG/5ML PO SOLN: 5.0000 mg | Freq: Once | ORAL | Status: AC | PRN

## 2021-07-31 MED ORDER — ONDANSETRON HCL 4 MG/2ML IJ SOLN
INTRAMUSCULAR | Status: DC | PRN
Start: 2021-07-31 — End: 2021-07-31
  Administered 2021-07-31: 4 mg via INTRAVENOUS

## 2021-07-31 MED ORDER — OXYCODONE HCL 5 MG PO TABS
5.0000 mg | ORAL_TABLET | Freq: Once | ORAL | Status: AC | PRN
  Administered 2021-07-31: 5 mg via ORAL

## 2021-07-31 SURGICAL SUPPLY — 54 items
BAG COUNTER SPONGE SURGICOUNT (BAG) IMPLANT
BIT DRILL 2.0 LNG QUCK RELEASE (BIT) IMPLANT
BIT DRILL 2.3 QUICK RELEASE (BIT) IMPLANT
BIT DRILL 2.8 QUICK RELEASE (BIT) IMPLANT
COVER SURGICAL LIGHT HANDLE (MISCELLANEOUS) ×2 IMPLANT
DRAPE C-ARM 42X120 X-RAY (DRAPES) ×2 IMPLANT
DRAPE ORTHO SPLIT 77X108 STRL (DRAPES) ×2
DRAPE SURG ORHT 6 SPLT 77X108 (DRAPES) ×2 IMPLANT
DRAPE U-SHAPE 47X51 STRL (DRAPES) ×2 IMPLANT
DRILL 2.0 LNG QUICK RELEASE (BIT) ×2
DRILL 2.3 QUICK RELEASE (BIT) ×2
DRILL 2.8 QUICK RELEASE (BIT) ×2
DRSG AQUACEL AG ADV 3.5X 6 (GAUZE/BANDAGES/DRESSINGS) ×2 IMPLANT
DRSG PAD ABDOMINAL 8X10 ST (GAUZE/BANDAGES/DRESSINGS) ×2 IMPLANT
DURAPREP 26ML APPLICATOR (WOUND CARE) ×2 IMPLANT
ELECT REM PT RETURN 15FT ADLT (MISCELLANEOUS) ×2 IMPLANT
GAUZE SPONGE 4X4 12PLY STRL (GAUZE/BANDAGES/DRESSINGS) ×1 IMPLANT
GLOVE SRG 8 PF TXTR STRL LF DI (GLOVE) ×1 IMPLANT
GLOVE SURG ENC MOIS LTX SZ7.5 (GLOVE) ×2 IMPLANT
GLOVE SURG POLYISO LF SZ6.5 (GLOVE) ×2 IMPLANT
GLOVE SURG UNDER POLY LF SZ6.5 (GLOVE) ×2 IMPLANT
GLOVE SURG UNDER POLY LF SZ8 (GLOVE) ×1
GOWN STRL REUS W/ TWL XL LVL3 (GOWN DISPOSABLE) ×1 IMPLANT
GOWN STRL REUS W/TWL LRG LVL3 (GOWN DISPOSABLE) ×2 IMPLANT
GOWN STRL REUS W/TWL XL LVL3 (GOWN DISPOSABLE) ×4 IMPLANT
KIT BASIN OR (CUSTOM PROCEDURE TRAY) ×2 IMPLANT
KIT TURNOVER KIT A (KITS) IMPLANT
MANIFOLD NEPTUNE II (INSTRUMENTS) ×2 IMPLANT
NDL HYPO 25X1 1.5 SAFETY (NEEDLE) ×1 IMPLANT
NEEDLE HYPO 25X1 1.5 SAFETY (NEEDLE) ×2 IMPLANT
NS IRRIG 1000ML POUR BTL (IV SOLUTION) ×2 IMPLANT
PACK SHOULDER (CUSTOM PROCEDURE TRAY) ×2 IMPLANT
PLATE LOCKING CLAVICLE 10 HOLE (Plate) ×1 IMPLANT
SCREW CORTICAL 2.3X14 (Screw) ×1 IMPLANT
SCREW CORTICAL 3.5X20MM (Screw) ×1 IMPLANT
SCREW HEXALOBE LOCK 3.5X22MM (Screw) ×1 IMPLANT
SCREW HEXALOBE LOCKING 3.5X14M (Screw) ×1 IMPLANT
SCREW HEXALOBE NON-LOCK 3.5X16 (Screw) ×1 IMPLANT
SCREW NON TOGG 2.3X16MM (Screw) ×1 IMPLANT
SCREW NON TOGG 2.3X18MM (Screw) ×1 IMPLANT
SLING ARM FOAM STRAP LRG (SOFTGOODS) ×2 IMPLANT
SLING ARM IMMOBILIZER LRG (SOFTGOODS) ×1 IMPLANT
STRIP CLOSURE SKIN 1/2X4 (GAUZE/BANDAGES/DRESSINGS) ×2 IMPLANT
SUCTION FRAZIER HANDLE 12FR (TUBING) ×1
SUCTION TUBE FRAZIER 12FR DISP (TUBING) ×1 IMPLANT
SUPPORT WRAP ARM LG (MISCELLANEOUS) ×2 IMPLANT
SUT MNCRL AB 4-0 PS2 18 (SUTURE) ×2 IMPLANT
SUT VIC AB 0 CT1 36 (SUTURE) ×2 IMPLANT
SUT VIC AB 2-0 CT1 27 (SUTURE) ×2
SUT VIC AB 2-0 CT1 TAPERPNT 27 (SUTURE) ×1 IMPLANT
SYR CONTROL 10ML LL (SYRINGE) ×2 IMPLANT
TOWEL OR 17X26 10 PK STRL BLUE (TOWEL DISPOSABLE) ×2 IMPLANT
WATER STERILE IRR 1000ML POUR (IV SOLUTION) ×1 IMPLANT
YANKAUER SUCT BULB TIP 10FT TU (MISCELLANEOUS) ×2 IMPLANT

## 2021-07-31 NOTE — H&P (Signed)
Phillip Thornton is an 46 y.o. male.   Chief Complaint: R shoulder injury HPI: s/p fall skiing with comminuted shortened R clavicle fracture.  Past Medical History:  Diagnosis Date   ADHD (attention deficit hyperactivity disorder)    Hypertension    Medical history non-contributory     Past Surgical History:  Procedure Laterality Date   ELBOW SURGERY     scar tissue resection   LUMBAR LAMINECTOMY/DECOMPRESSION MICRODISCECTOMY Right 02/07/2018   Procedure: Microdiscectomy - Lumbar Five - Sacral One - right;  Surgeon: Donalee Citrin, MD;  Location: [facility] OR;  Service: Neurosurgery;  Laterality: Right;   WISDOM TOOTH EXTRACTION      No family history on file. Social History:  reports that he has never smoked. He has never used smokeless tobacco. He reports current alcohol use of about 4.0 standard drinks per week. He reports that he does not use drugs.  Allergies: No Known Allergies  Medications Prior to Admission  Medication Sig Dispense Refill   oxyCODONE-acetaminophen (PERCOCET/ROXICET) 5-325 MG tablet Take 1 tablet by mouth every 6 (six) hours as needed for severe pain.     losartan (COZAAR) 25 MG tablet Take 25 mg by mouth daily.      No results found for this or any previous visit (from the past 48 hour(s)). No results found.  Review of Systems  All other systems reviewed and are negative.  There were no vitals taken for this visit. Physical Exam Constitutional:      Appearance: He is well-developed.  HENT:     Head: Atraumatic.  Eyes:     Extraocular Movements: Extraocular movements intact.  Cardiovascular:     Pulses: Normal pulses.  Pulmonary:     Effort: Pulmonary effort is normal.  Musculoskeletal:     Comments: R clavicle ecchymotic swollen, TTP. NVID  Skin:    General: Skin is warm and dry.  Neurological:     Mental Status: He is alert and oriented to person, place, and time.  Psychiatric:        Mood and Affect: Mood normal.     Assessment/Plan s/p fall  skiing with comminuted shortened R clavicle fracture. Plan ORIF R clavicle fracture Risks / benefits of surgery discussed Consent on chart  NPO for OR Preop antibiotics   Glennon Hamilton, MD 07/31/2021, 1:49 PM

## 2021-07-31 NOTE — Anesthesia Preprocedure Evaluation (Addendum)
Anesthesia Evaluation  Patient identified by MRN, date of birth, ID band Patient awake    Reviewed: Allergy & Precautions, NPO status , Patient's Chart, lab work & pertinent test results  History of Anesthesia Complications Negative for: history of anesthetic complications  Airway Mallampati: II  TM Distance: >3 FB Neck ROM: Full    Dental no notable dental hx.    Pulmonary neg pulmonary ROS,    Pulmonary exam normal        Cardiovascular hypertension, Pt. on medications Normal cardiovascular exam     Neuro/Psych ADHDnegative neurological ROS     GI/Hepatic negative GI ROS, Neg liver ROS,   Endo/Other  negative endocrine ROS  Renal/GU negative Renal ROS  negative genitourinary   Musculoskeletal Right clavicle fracture   Abdominal   Peds  Hematology negative hematology ROS (+)   Anesthesia Other Findings Day of surgery medications reviewed with patient.  Reproductive/Obstetrics negative OB ROS                            Anesthesia Physical Anesthesia Plan  ASA: 2  Anesthesia Plan: General   Post-op Pain Management: Tylenol PO (pre-op)   Induction:   PONV Risk Score and Plan: 3 and Treatment may vary due to age or medical condition, Ondansetron, Dexamethasone and Midazolam  Airway Management Planned: Oral ETT  Additional Equipment: None  Intra-op Plan:   Post-operative Plan: Extubation in OR  Informed Consent: I have reviewed the patients History and Physical, chart, labs and discussed the procedure including the risks, benefits and alternatives for the proposed anesthesia with the patient or authorized representative who has indicated his/her understanding and acceptance.     Dental advisory given  Plan Discussed with: CRNA  Anesthesia Plan Comments:        Anesthesia Quick Evaluation

## 2021-07-31 NOTE — Op Note (Signed)
Procedure(s): OPEN REDUCTION INTERNAL FIXATION (ORIF) CLAVICULAR FRACTURE Procedure Note  Phillip Thornton male 46 y.o. 07/31/2021  Preoperative diagnosis: Right comminuted shortened clavicle fracture  Postoperative diagnosis: Same  Procedure(s) and Anesthesia Type:    * OPEN REDUCTION INTERNAL FIXATION (ORIF) CLAVICULAR FRACTURE - Choice  Surgeon(s) and Role:    Tania Ade, MD - Primary   Indications:  46 y.o. male s/p fall while skiing with right clavicle fracture. Indicated for surgery to promote anatomic restoration anatomy, improve functional outcome and avoid skin complications.     Surgeon: Rhae Hammock   Assistants: Sheryle Hail PA-C Phillip Thornton was present and scrubbed throughout the procedure and was essential in positioning, retraction, exposure, and closure)  Anesthesia: General endotracheal anesthesia     Procedure Detail  OPEN REDUCTION INTERNAL FIXATION (ORIF) CLAVICULAR FRACTURE  Findings: Anatomic reduction with 3 interfragmentary screws and a Acumed locking plate  Estimated Blood Loss:  less than 50 mL         Drains: none  Blood Given: none         Specimens: none        Complications:  * No complications entered in OR log *         Disposition: PACU - hemodynamically stable.         Condition: stable    Procedure:   DESCRIPTION OF PROCEDURE: The patient was identified in preoperative  holding area where I personally marked the operative site after  verifying site, side, and procedure with the patient. The patient was taken back  to the operating room where general anesthesia was induced without  complication and was placed in the beach-chair position with the back  elevated about 40 degrees and all extremities carefully padded and  positioned. The neck was turned very slightly away from the operative field  to assist in exposure. The right upper extremity was then prepped and  draped in a standard sterile fashion. The  appropriate time-out  procedure was carried out. The patient did receive IV antibiotics  within 30 minutes of incision.  An incision was made in [employer] centered over the fracture site. Dissection was carried down through subcutaneous tissues and medial and lateral skin flaps were elevated.  The deltotrapezial fascia was then opened over the clavicle and the  medial and lateral fracture fragments were carefully exposed, taking great care to protect underlying neurovascular structures.  1 Butterfly fragment was kept in continuity with soft tissue as to not disrupt blood supply. The fracture was long oblique.  A total of 3 2.3 mm interfragmentary screws were used.  The plate was positioned on the bone using fluoroscopic imaging to verify position. Locking and non locking screws were then used at the periphery of the plate and flouroscopic imaging demonstrated appropriate position and screw lengths.  The wound was copiously irrigated with normal saline and the deltotrapezial fascia was  then carefully closed over the construct with #0 vicryl sutures in  interrupted fashion. The skin was then closed with 2-0 Vicryl in a deep  dermal layer, 4-0 Monocryl for skin closure. Steri-Strips were applied.  10 mL of 0.25% Marcaine with epinephrine were infiltrated for  postoperative pain. Sterile dressings were applied including a medium  Mepilex dressing. The patient was then allowed to awaken from general  anesthesia, placed in a sling, transferred to stretcher and taken to the  recovery room in stable condition.   POSTOPERATIVE PLAN: He will be observed in the recovery room and if his pain  is well controlled he can be discharged home today with family.  He will follow-up in 2 weeks.

## 2021-07-31 NOTE — Anesthesia Procedure Notes (Addendum)
Procedure Name: Intubation Date/Time: 07/31/2021 3:41 PM Performed by: Brennan Bailey, MD Pre-anesthesia Checklist: Patient identified, Emergency Drugs available, Suction available and Patient being monitored Patient Re-evaluated:Patient Re-evaluated prior to induction Oxygen Delivery Method: Circle system utilized Preoxygenation: Pre-oxygenation with 100% oxygen Induction Type: IV induction Ventilation: Mask ventilation without difficulty Laryngoscope Size: Glidescope and 4 Grade View: Grade I Tube type: Oral Tube size: 7.5 mm Number of attempts: 2 Airway Equipment and Method: Stylet and Video-laryngoscopy Placement Confirmation: ETT inserted through vocal cords under direct vision, positive ETCO2 and breath sounds checked- equal and bilateral Secured at: 25 cm Tube secured with: Tape Dental Injury: Teeth and Oropharynx as per pre-operative assessment  Comments: DL MAC 4 by CRNA, grade 3 view. DL Miller 3 Dr. Daiva Huge, same view. Mask ventilation. Gildescope S4 grade 1 view.

## 2021-07-31 NOTE — Anesthesia Postprocedure Evaluation (Signed)
Anesthesia Post Note  Patient: Phillip Thornton  Procedure(s) Performed: OPEN REDUCTION INTERNAL FIXATION (ORIF) CLAVICULAR FRACTURE (Right)     Patient location during evaluation: PACU Anesthesia Type: General Level of consciousness: awake and alert Pain management: pain level controlled Vital Signs Assessment: post-procedure vital signs reviewed and stable Respiratory status: spontaneous breathing, nonlabored ventilation and respiratory function stable Cardiovascular status: blood pressure returned to baseline Postop Assessment: no apparent nausea or vomiting Anesthetic complications: no   No notable events documented.  Last Vitals:  Vitals:   07/31/21 1745 07/31/21 1800  BP: 140/85 (!) 141/90  Pulse: 68 74  Resp: 10 12  Temp:  36.7 C  SpO2: 98% 98%    Last Pain:  Vitals:   07/31/21 1800  TempSrc:   PainSc: 6                  Shanda Howells

## 2021-07-31 NOTE — Discharge Instructions (Addendum)
Discharge Instructions after Clavicle Fracture Surgery  A sling has been provided for you. Remain in your sling at all times. This includes sleeping in your sling.  Use ice on the shoulder intermittently over the first 48 hours after surgery.  Pain medicine has been prescribed for you.  Use your medicine liberally over the first 48 hours, and then you can begin to taper your use. You may take Extra Strength Tylenol or Tylenol only in place of the pain pills. DO NOT take ANY nonsteroidal anti-inflammatory pain medications: Advil, Motrin, Ibuprofen, Aleve, Naproxen or Naprosyn.  Leave your dressing on until your first follow up visit.  You may shower with the dressing.  Hold your arm as if you still have your sling on while you shower. Simply allow the water to wash over the site and then pat dry. Make sure your axilla (armpit) is completely dry after showering. Take one aspirin, a day for 2 weeks after surgery, unless you have an aspirin sensitivity/ allergy or asthma.   Please call [phone] during normal business hours or [phone] after hours for any problems. Including the following:  - excessive redness of the incisions - drainage for more than 4 days - fever of more than 101.5 F  *Please note that pain medications will not be refilled after hours or on weekends.

## 2021-07-31 NOTE — Transfer of Care (Signed)
Immediate Anesthesia Transfer of Care Note  Patient: Phillip Thornton  Procedure(s) Performed: OPEN REDUCTION INTERNAL FIXATION (ORIF) CLAVICULAR FRACTURE (Right)  Patient Location: PACU  Anesthesia Type:General  Level of Consciousness: awake, alert  and oriented  Airway & Oxygen Therapy: Patient Spontanous Breathing and Patient connected to face mask oxygen  Post-op Assessment: Report given to RN, Post -op Vital signs reviewed and stable and Patient moving all extremities X 4  Post vital signs: Reviewed and stable  Last Vitals:  Vitals Value Taken Time  BP 143/85   Temp    Pulse 82 07/31/21 1724  Resp 14 07/31/21 1724  SpO2 99 % 07/31/21 1724  Vitals shown include unvalidated device data.  Last Pain:  Vitals:   07/31/21 1405  TempSrc: Oral  PainSc: 6          Complications: No notable events documented.

## 2021-08-01 ENCOUNTER — Encounter (HOSPITAL_COMMUNITY): Payer: Self-pay | Admitting: Orthopedic Surgery

## 2022-11-12 ENCOUNTER — Other Ambulatory Visit (HOSPITAL_BASED_OUTPATIENT_CLINIC_OR_DEPARTMENT_OTHER): Payer: Self-pay

## 2022-11-12 ENCOUNTER — Other Ambulatory Visit: Payer: Self-pay

## 2022-11-12 MED ORDER — LISDEXAMFETAMINE DIMESYLATE 50 MG PO CAPS
50.0000 mg | ORAL_CAPSULE | Freq: Every day | ORAL | 0 refills | Status: DC
  Filled 2022-11-12: qty 30, 30d supply, fill #0

## 2023-01-06 ENCOUNTER — Other Ambulatory Visit (HOSPITAL_BASED_OUTPATIENT_CLINIC_OR_DEPARTMENT_OTHER): Payer: Self-pay

## 2023-01-06 ENCOUNTER — Other Ambulatory Visit: Payer: Self-pay

## 2023-01-06 MED ORDER — LISDEXAMFETAMINE DIMESYLATE 50 MG PO CAPS
50.0000 mg | ORAL_CAPSULE | Freq: Every day | ORAL | 0 refills | Status: DC
  Filled 2023-01-06: qty 30, 30d supply, fill #0

## 2023-03-17 ENCOUNTER — Other Ambulatory Visit (HOSPITAL_BASED_OUTPATIENT_CLINIC_OR_DEPARTMENT_OTHER): Payer: Self-pay

## 2023-03-17 MED ORDER — LISDEXAMFETAMINE DIMESYLATE 50 MG PO CAPS
50.0000 mg | ORAL_CAPSULE | Freq: Every day | ORAL | 0 refills | Status: DC
  Filled 2023-03-17: qty 30, 30d supply, fill #0

## 2023-05-05 ENCOUNTER — Other Ambulatory Visit (HOSPITAL_BASED_OUTPATIENT_CLINIC_OR_DEPARTMENT_OTHER): Payer: Self-pay

## 2023-05-05 MED ORDER — LISDEXAMFETAMINE DIMESYLATE 50 MG PO CAPS
50.0000 mg | ORAL_CAPSULE | Freq: Every day | ORAL | 0 refills | Status: DC
  Filled 2023-05-05: qty 30, 30d supply, fill #0

## 2023-05-25 IMAGING — RF DG CLAVICLE*R*
1 series · 3 of 3 positions shown · non-contrast
Comparison: None.

CLINICAL DATA: Right clavicle repair

EXAM:
RIGHT CLAVICLE - 2+ VIEWS

[Series 1: unknown protocol · 0.14mm/px · 3 of 3 slices shown]
[im 1/3]
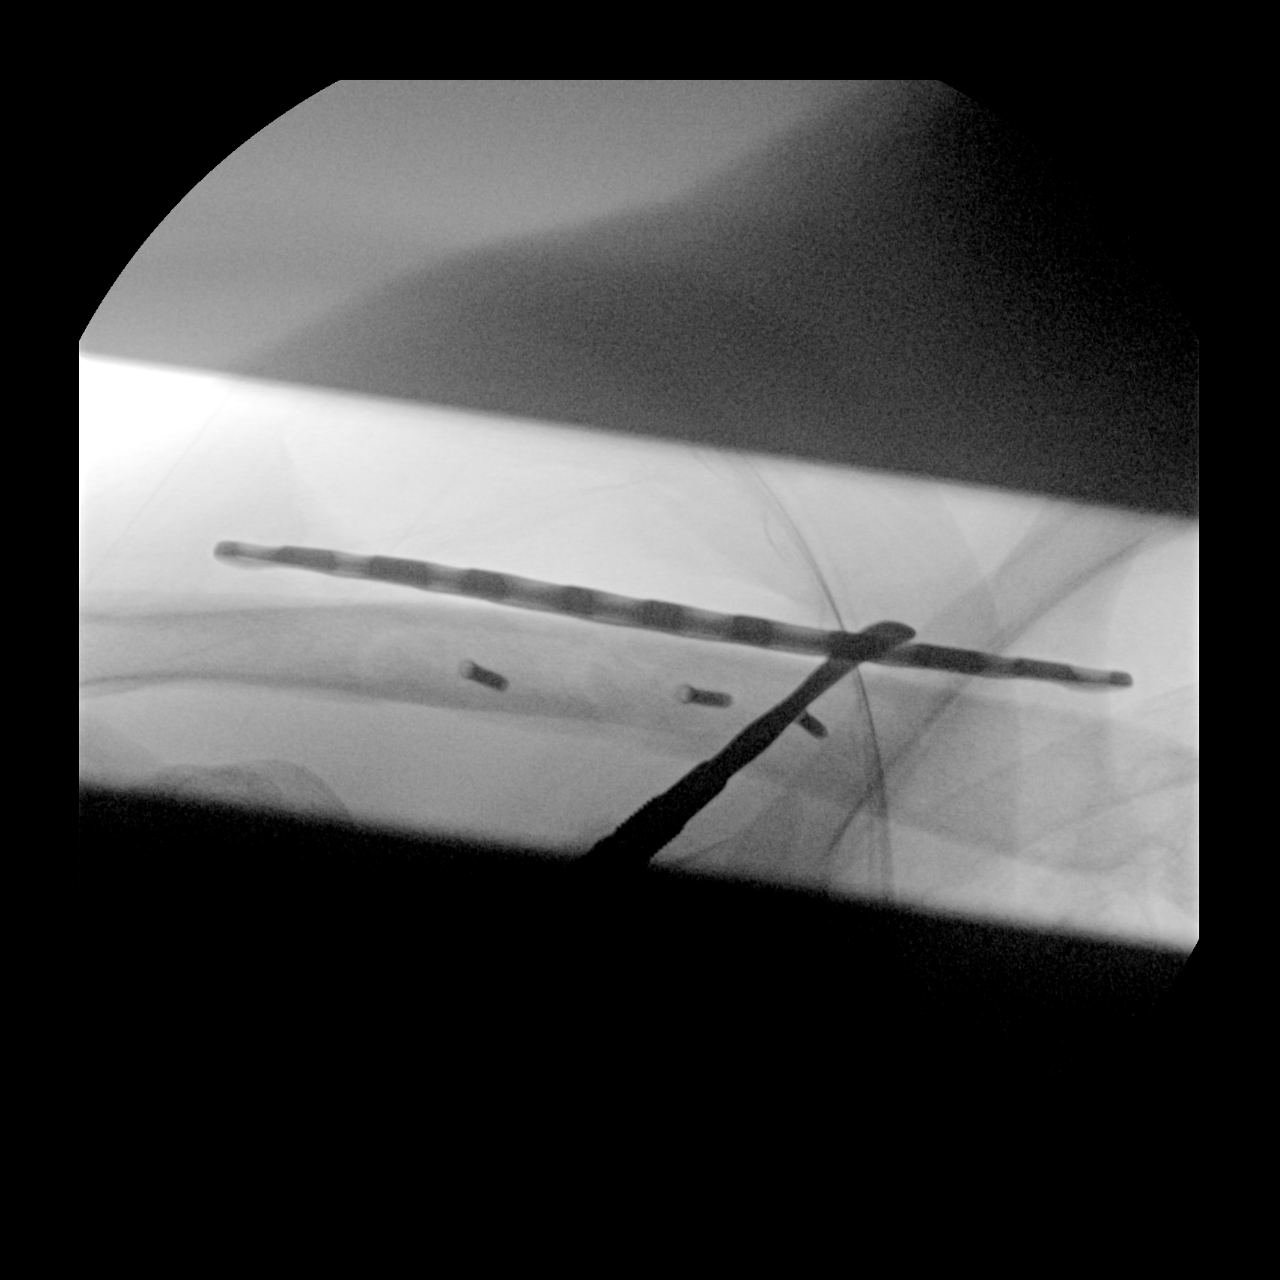
[im 2/3]
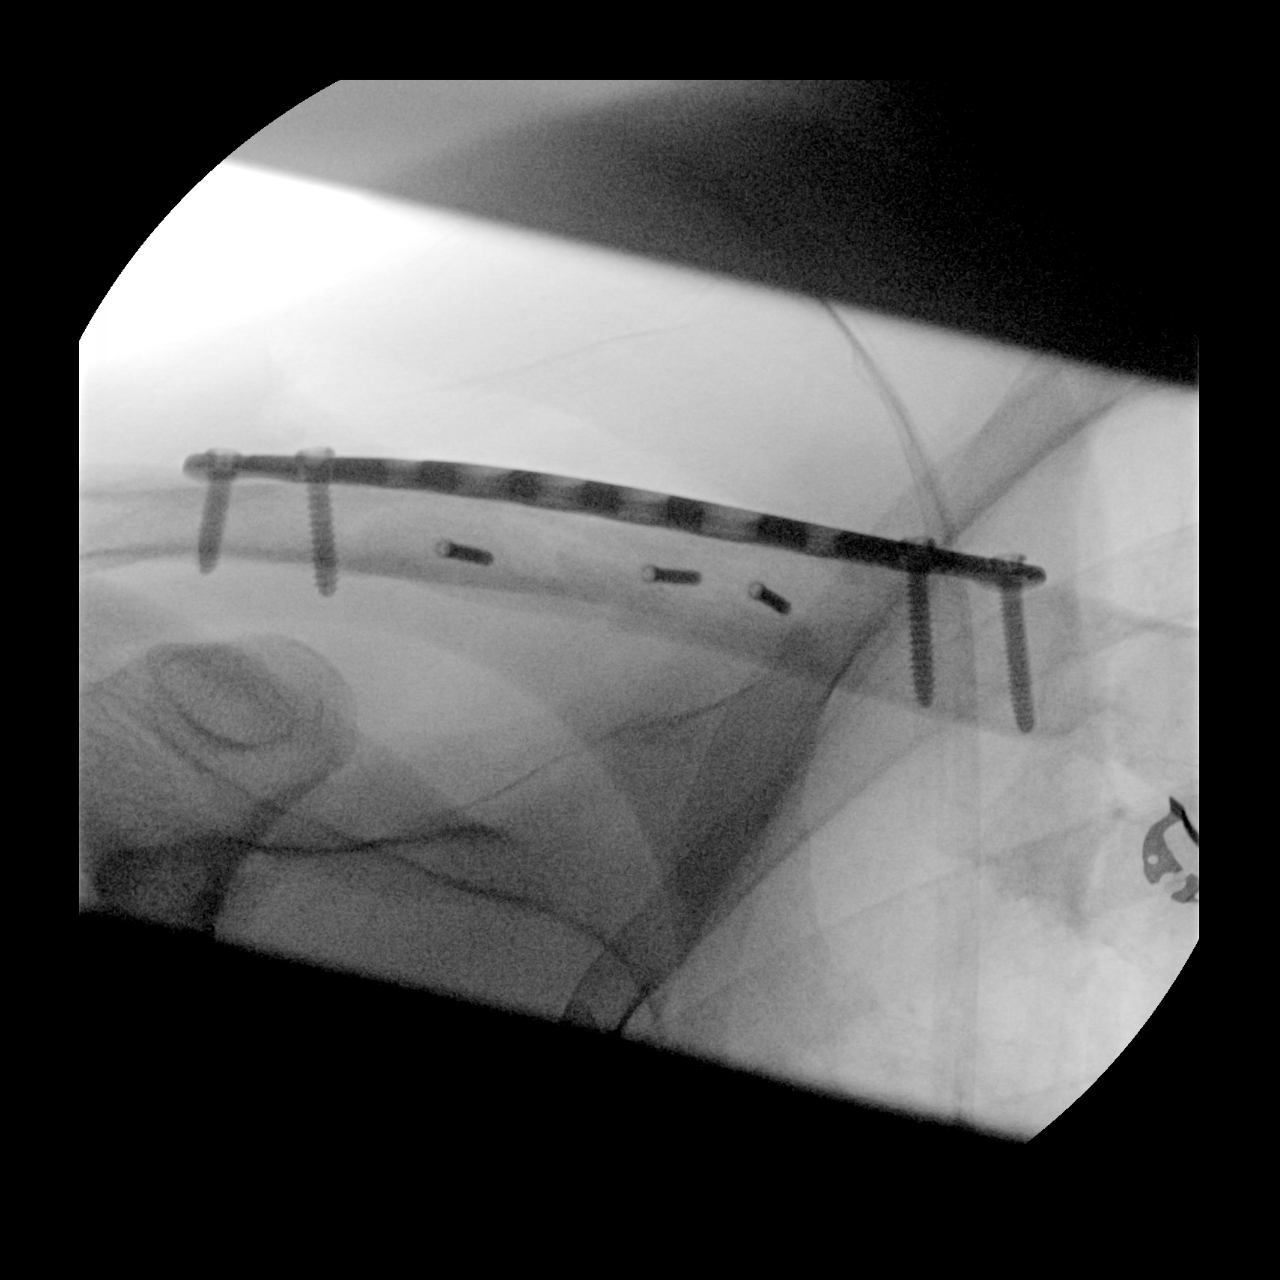
[im 3/3]
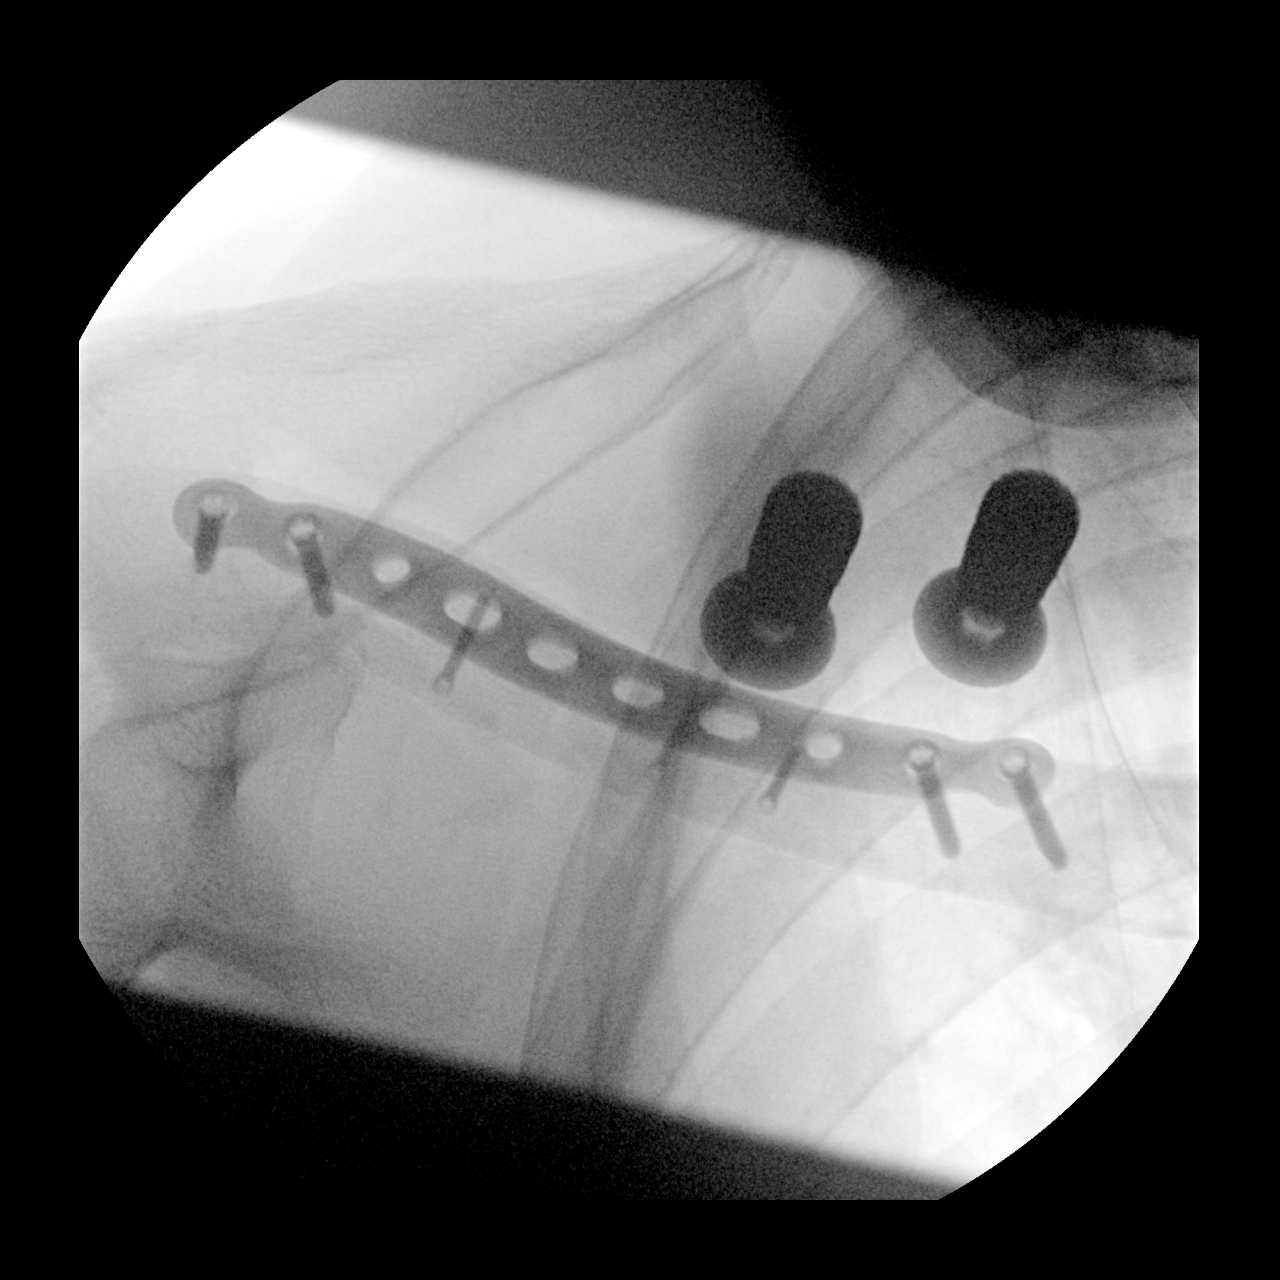

[3 of 3 positions shown; findings below may reference images not displayed]

FINDINGS: Three fluoroscopic images are obtained during the performance of the
procedure and are provided for interpretation only. Mild though
plate and screws are placed across the right clavicle, with anatomic
alignment. Please refer to the operative report.

Fluoroscopy time: 11 seconds, 1.1776 mGy
IMPRESSION: 1. Right clavicle repair as above. Please refer to the operative
report.

## 2023-06-15 ENCOUNTER — Other Ambulatory Visit: Payer: Self-pay

## 2023-06-15 ENCOUNTER — Other Ambulatory Visit (HOSPITAL_BASED_OUTPATIENT_CLINIC_OR_DEPARTMENT_OTHER): Payer: Self-pay

## 2023-06-15 MED ORDER — LISDEXAMFETAMINE DIMESYLATE 50 MG PO CAPS
50.0000 mg | ORAL_CAPSULE | Freq: Every day | ORAL | 0 refills | Status: AC
  Filled 2023-06-15: qty 30, 30d supply, fill #0

## 2023-06-21 ENCOUNTER — Other Ambulatory Visit (HOSPITAL_BASED_OUTPATIENT_CLINIC_OR_DEPARTMENT_OTHER): Payer: Self-pay

## 2023-07-16 ENCOUNTER — Other Ambulatory Visit (HOSPITAL_BASED_OUTPATIENT_CLINIC_OR_DEPARTMENT_OTHER): Payer: Self-pay

## 2023-07-16 MED ORDER — LISDEXAMFETAMINE DIMESYLATE 60 MG PO CAPS
60.0000 mg | ORAL_CAPSULE | Freq: Every day | ORAL | 0 refills | Status: DC
  Filled 2023-07-16: qty 30, 30d supply, fill #0

## 2023-09-14 ENCOUNTER — Other Ambulatory Visit (HOSPITAL_BASED_OUTPATIENT_CLINIC_OR_DEPARTMENT_OTHER): Payer: Self-pay

## 2023-09-14 MED ORDER — LISDEXAMFETAMINE DIMESYLATE 60 MG PO CAPS
60.0000 mg | ORAL_CAPSULE | Freq: Every day | ORAL | 0 refills | Status: AC
  Filled 2023-09-14: qty 30, 30d supply, fill #0

## 2023-10-12 ENCOUNTER — Other Ambulatory Visit (HOSPITAL_BASED_OUTPATIENT_CLINIC_OR_DEPARTMENT_OTHER): Payer: Self-pay

## 2023-10-12 MED ORDER — LISDEXAMFETAMINE DIMESYLATE 60 MG PO CAPS
60.0000 mg | ORAL_CAPSULE | Freq: Every day | ORAL | 0 refills | Status: AC
Start: 2023-12-11 — End: ?
  Filled 2024-03-11: qty 30, 30d supply, fill #0

## 2023-10-12 MED ORDER — LISDEXAMFETAMINE DIMESYLATE 60 MG PO CAPS
ORAL_CAPSULE | ORAL | 0 refills | Status: AC
  Filled 2023-12-17: qty 30, 30d supply, fill #0

## 2023-10-12 MED ORDER — LISDEXAMFETAMINE DIMESYLATE 60 MG PO CAPS
60.0000 mg | ORAL_CAPSULE | Freq: Every day | ORAL | 0 refills | Status: AC
  Filled 2023-10-26: qty 30, 30d supply, fill #0

## 2023-10-26 ENCOUNTER — Other Ambulatory Visit (HOSPITAL_BASED_OUTPATIENT_CLINIC_OR_DEPARTMENT_OTHER): Payer: Self-pay

## 2023-11-29 ENCOUNTER — Other Ambulatory Visit (HOSPITAL_BASED_OUTPATIENT_CLINIC_OR_DEPARTMENT_OTHER): Payer: Self-pay

## 2023-12-17 ENCOUNTER — Other Ambulatory Visit (HOSPITAL_BASED_OUTPATIENT_CLINIC_OR_DEPARTMENT_OTHER): Payer: Self-pay

## 2024-01-14 ENCOUNTER — Other Ambulatory Visit (HOSPITAL_BASED_OUTPATIENT_CLINIC_OR_DEPARTMENT_OTHER): Payer: Self-pay

## 2024-01-14 MED ORDER — LISDEXAMFETAMINE DIMESYLATE 60 MG PO CAPS: 60.0000 mg | ORAL_CAPSULE | Freq: Every day | ORAL | 0 refills | Status: AC

## 2024-01-14 MED ORDER — LISDEXAMFETAMINE DIMESYLATE 60 MG PO CAPS
60.0000 mg | ORAL_CAPSULE | Freq: Every day | ORAL | 0 refills | Status: AC
Start: 2024-02-13 — End: ?
  Filled 2024-04-06 – 2024-04-10 (×2): qty 30, 30d supply, fill #0
  Filled ????-??-??: fill #0

## 2024-03-11 ENCOUNTER — Other Ambulatory Visit (HOSPITAL_BASED_OUTPATIENT_CLINIC_OR_DEPARTMENT_OTHER): Payer: Self-pay

## 2024-03-13 ENCOUNTER — Other Ambulatory Visit: Payer: Self-pay

## 2024-04-06 ENCOUNTER — Other Ambulatory Visit (HOSPITAL_BASED_OUTPATIENT_CLINIC_OR_DEPARTMENT_OTHER): Payer: Self-pay

## 2024-04-07 ENCOUNTER — Other Ambulatory Visit (HOSPITAL_BASED_OUTPATIENT_CLINIC_OR_DEPARTMENT_OTHER): Payer: Self-pay

## 2024-04-10 ENCOUNTER — Other Ambulatory Visit (HOSPITAL_BASED_OUTPATIENT_CLINIC_OR_DEPARTMENT_OTHER): Payer: Self-pay

## 2024-04-14 ENCOUNTER — Other Ambulatory Visit (HOSPITAL_BASED_OUTPATIENT_CLINIC_OR_DEPARTMENT_OTHER): Payer: Self-pay

## 2024-04-14 MED ORDER — LISDEXAMFETAMINE DIMESYLATE 60 MG PO CAPS: 60.0000 mg | ORAL_CAPSULE | Freq: Every day | ORAL | 0 refills | Status: AC
